# Patient Record
Sex: Female | Born: 1995 | Marital: Single | Smoking: Current every day smoker
Health system: Northeastern US, Academic
[De-identification: ages and names within clinical notes are randomized; demographics above are authoritative.]

## PROBLEM LIST (undated history)

## (undated) DIAGNOSIS — L0591 Pilonidal cyst without abscess: Secondary | ICD-10-CM

## (undated) HISTORY — DX: Pilonidal cyst without abscess: L05.91

## (undated) HISTORY — PX: WISDOM TOOTH EXTRACTION: SHX21

---
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
## 2013-04-25 ENCOUNTER — Emergency Department: Payer: Self-pay | Admitting: Emergency Medicine

## 2013-08-06 ENCOUNTER — Emergency Department: Payer: Self-pay | Admitting: Emergency Medicine

## 2013-08-06 LAB — URINALYSIS, COMPLETE
BILIRUBIN, UR: NEGATIVE
Glucose,UR: NEGATIVE mg/dL (ref 0–75)
KETONE: NEGATIVE
NITRITE: NEGATIVE
Ph: 6 (ref 4.5–8.0)
SPECIFIC GRAVITY: 1.009 (ref 1.003–1.030)
Squamous Epithelial: 1

## 2014-04-24 ENCOUNTER — Emergency Department: Payer: Self-pay | Admitting: Emergency Medicine

## 2014-06-05 NOTE — Op Note (Signed)
PATIENT NAME:  Lauren Cunningham, Lauren Cunningham MR#:  [id number] DATE OF BIRTH:  1995-05-05  DATE OF PROCEDURE:  05/21/2012  PREOPERATIVE DIAGNOSIS: Recurrent pilonidal abscess.   POSTOPERATIVE DIAGNOSIS: Recurrent pilonidal abscess.   OPERATION: Incision and drainage, pilonidal abscess.    SURGEON: Dr. Michela PitcherEly.   ANESTHESIA: General.  OPERATIVE PROCEDURE: With the patient in the supine position and the induction of appropriate general anesthesia she was placed in the prone position, appropriately padded and positioned. Buttocks cheeks were properly taped apart. The area was prepped and draped with sterile towels. The open area was probed and a large amount of foul-smelling purulent material was removed. Two counter-incisions were made. The abscess appeared to extend approximately 3 cm above the end of the previous incision. Penrose drains were placed because of the copious amount of infection. Suture was placed with 3-0 nylon. Sterile dressings were applied. The patient was awakened and returned to the recovery room in satisfactory.   Sponge, instrument, and needle counts were correct x 2 in the operating room.     ____________________________ Quentin Orealph L. Ely III, MD rle:dm D: 05/21/2012 11:30:00 ET Cunningham: 05/21/2012 12:10:55 ET JOB#: [id number]  cc: Carmie Endalph L. Ely III, MD, <Dictator> Quentin OreALPH L ELY MD ELECTRONICALLY SIGNED 05/22/2012 11:38

## 2014-06-07 NOTE — Op Note (Signed)
PATIENT NAME:  Lauren Cunningham, Lauren Cunningham MR#:  [id number] DATE OF BIRTH:  07/28/1995  DATE OF PROCEDURE:  08/21/2011  PREOPERATIVE DIAGNOSIS: Pilonidal cyst.   POSTOPERATIVE DIAGNOSIS: Pilonidal cyst.   OPERATION: Pilonidal cystectomy.    ANESTHESIA: General.   SURGEON: Quentin Orealph L. Ely, III, MD   OPERATIVE PROCEDURE: With the patient in the supine position and after the induction of appropriate general anesthesia, the patient was placed in the prone position, appropriately padded and positioned. Her buttocks were taped apart and the perineal area was prepped with Betadine. The area was infiltrated through one of the sinus tracts with methylene blue to help identify any residual tracts. An elliptical incision was made around the site of the cyst. Incision was carried down through the subcutaneous tissue with Bovie electrocautery. No sinus tracts were crossed containing methylene blue. The specimen was taken off the sacrum. It was marked and sent for pathology. It was copiously irrigated. Space was obliterated with three layers of 0 Vicryl. Subcutaneous space was closed with 3-0 Vicryl in a running fashion. Skin was closed with 3-0 nylon. Sterile dressing was applied. The patient was returned to the recovery room having tolerated the procedure well. Sponge, instrument, and needle counts were correct x2 in the operating room.   ____________________________ Quentin Orealph L. Ely III, MD rle:drc D: 08/21/2011 09:43:49 ET T: 08/21/2011 13:17:16 ET JOB#: [id number]  cc: Quentin Orealph L. Ely III, MD, <Dictator> Quentin OreALPH L ELY MD ELECTRONICALLY SIGNED 08/22/2011 16:01

## 2015-06-15 DIAGNOSIS — L0591 Pilonidal cyst without abscess: Secondary | ICD-10-CM | POA: Insufficient documentation

## 2015-06-15 NOTE — ED Notes (Addendum)
Pt in with co pain to coccyx for few months fell last week and has had pain to have pilonidal cyst removed.  Feels like incision site "pinches" at times.

## 2015-06-16 ENCOUNTER — Encounter: Payer: Self-pay | Admitting: Emergency Medicine

## 2015-06-16 ENCOUNTER — Emergency Department
Admission: EM | Admit: 2015-06-16 | Discharge: 2015-06-16 | Disposition: A | Discharge status: Home / Self Care | Payer: Self-pay | Attending: Emergency Medicine | Admitting: Emergency Medicine

## 2015-06-16 ENCOUNTER — Emergency Department: Payer: Self-pay

## 2015-06-16 DIAGNOSIS — L0591 Pilonidal cyst without abscess: Secondary | ICD-10-CM

## 2015-06-16 DIAGNOSIS — M533 Sacrococcygeal disorders, not elsewhere classified: Secondary | ICD-10-CM

## 2015-06-16 MED ORDER — IBUPROFEN 600 MG PO TABS
600.0000 mg | ORAL_TABLET | Freq: Once | ORAL | Status: AC
  Administered 2015-06-16: 600 mg via ORAL
  Filled 2015-06-16: qty 1

## 2015-06-16 MED ORDER — SULFAMETHOXAZOLE-TRIMETHOPRIM 800-160 MG PO TABS
1.0000 | ORAL_TABLET | Freq: Once | ORAL | Status: AC
  Administered 2015-06-16: 1 via ORAL
  Filled 2015-06-16: qty 1

## 2015-06-16 MED ORDER — SULFAMETHOXAZOLE-TRIMETHOPRIM 800-160 MG PO TABS
1.0000 | ORAL_TABLET | Freq: Two times a day (BID) | ORAL | Status: AC
Start: 2015-06-16 — End: 2015-06-22

## 2015-06-16 NOTE — Discharge Instructions (Signed)
Pilonidal Cyst  A pilonidal cyst is a fluid-filled sac. It forms beneath the skin near your tailbone, at the top of the crease of your buttocks. A pilonidal cyst that is not large or infected may not cause symptoms or problems.  If the cyst becomes irritated or infected, it may fill with pus. This causes pain and swelling (pilonidal abscess). An infected cyst may need to be treated with medicine, drained, or removed.  CAUSES  The cause of a pilonidal cyst is not known. One cause may be a hair that grows into your skin (ingrown hair).  RISK FACTORS  Pilonidal cysts are more common in boys and men. Risk factors include:  · Having lots of hair near the crease of the buttocks.  · Being overweight.  · Having a pilonidal dimple.  · Wearing tight clothing.  · Not bathing or showering frequently.  · Sitting for long periods of time.  SIGNS AND SYMPTOMS  Signs and symptoms of a pilonidal cyst may include:  · Redness.  · Pain and tenderness.  · Warmth.  · Swelling.  · Pus.  · Fever.  DIAGNOSIS  Your health care provider may diagnose a pilonidal cyst based on your symptoms and a physical exam. The health care provider may do a blood test to check for infection. If your cyst is draining pus, your health care provider may take a sample of the drainage to be tested at a laboratory.  TREATMENT  Surgery is the usual treatment for an infected pilonidal cyst. You may also have to take medicines before surgery. The type of surgery you have depends on the size and severity of the infected cyst. The different kinds of surgery include:  · Incision and drainage. This is a procedure to open and drain the cyst.  · Marsupialization. In this procedure, a large cyst or abscess may be opened and kept open by stitching the edges of the skin to the cyst walls.  · Cyst removal. This procedure involves opening the skin and removing all or part of the cyst.  HOME CARE INSTRUCTIONS  · Follow all of your surgeon's instructions carefully if you had  surgery.  · Take medicines only as directed by your health care provider.  · If you were prescribed an antibiotic medicine, finish it all even if you start to feel better.  · Keep the area around your pilonidal cyst clean and dry.  · Clean the area as directed by your health care provider. Pat the area dry with a clean towel. Do not rub it as this may cause bleeding.  · Remove hair from the area around the cyst as directed by your health care provider.  · Do not wear tight clothing or sit in one place for long periods of time.  · There are many different ways to close and cover an incision, including stitches, skin glue, and adhesive strips. Follow your health care provider's instructions on:    Incision care.    Bandage (dressing) changes and removal.    Incision closure removal.  SEEK MEDICAL CARE IF:   · You have drainage, redness, swelling, or pain at the site of the cyst.  · You have a fever.     This information is not intended to replace advice given to you by your health care provider. Make sure you discuss any questions you have with your health care provider.     Document Released: 01/28/2000 Document Revised: 02/20/2014 Document Reviewed: 06/19/2013  Elsevier Interactive Patient   Education ©2016 [employer]  -

## 2015-06-16 NOTE — ED Provider Notes (Signed)
[facility] Emergency Department Provider Note  ____________________________________________  Time seen: 1:50 AM  I have reviewed the triage vital signs and the nursing notes.   HISTORY  Chief Complaint Tailbone Pain     HPI Lauren FUERTES is a 20 y.o. female with history of pilonidal cyst removal performed by Dr. Michela Pitcher 4 years ago presents with currently 2 out of 10 pain at the site of her pilonidal cyst status post falling on that area approximately one week ago. Patient states that she has felt a pinching sensation at the site of the cyst removal for a few months but has worsened over the past week. Patient denies any aggravating or alleviating factors at this time.  Past medical history Pilonidal cysts There are no active problems to display for this patient.   Past Surgical History  Procedure Laterality Date  . Pilonidal cyst surgery      Current Outpatient Rx  Name  Route  Sig  Dispense  Refill  . medroxyPROGESTERone (DEPO-PROVERA) 150 MG/ML injection   Intramuscular   Inject 150 mg into the muscle every 3 (three) months.           Allergies No known drug allergies. History reviewed. No pertinent family history.  Social History Social History  Substance Use Topics  . Smoking status: Never Smoker   . Smokeless tobacco: None  . Alcohol Use: No    Review of Systems  Constitutional: Negative for fever. Eyes: Negative for visual changes. ENT: Negative for sore throat. Cardiovascular: Negative for chest pain. Respiratory: Negative for shortness of breath. Gastrointestinal: Negative for abdominal pain, vomiting and diarrhea. Genitourinary: Negative for dysuria. Musculoskeletal: Negative for back pain.Pain at lumbosacral area Skin: Negative for rash. Neurological: Negative for headaches, focal weakness or numbness.  10-point ROS otherwise negative.  ____________________________________________   PHYSICAL EXAM:  VITAL  SIGNS: ED Triage Vitals  Enc Vitals Group     BP 06/15/15 2332 115/73 mmHg     Pulse Rate 06/15/15 2332 80     Resp 06/15/15 2332 18     Temp 06/15/15 2332 98.2 F (36.8 C)     Temp Source 06/15/15 2332 Oral     SpO2 06/15/15 2332 100 %     Weight 06/15/15 2332 155 lb (70.308 kg)     Height 06/15/15 2332  (1.6 m)     Head Cir --      Peak Flow --      Pain Score 06/15/15 2333 3     Pain Loc --      Pain Edu? --      Excl. in GC? --      Constitutional: Alert and oriented. Well appearing and in no distress. Eyes: Conjunctivae are normal. PERRL. Normal extraocular movements. ENT   Head: Normocephalic and atraumatic.   Nose: No congestion/rhinnorhea.   Mouth/Throat: Mucous membranes are moist.   Neck: No stridor. Hematological/Lymphatic/Immunilogical: No cervical lymphadenopathy. Cardiovascular: Normal rate, regular rhythm. Normal and symmetric distal pulses are present in all extremities. No murmurs, rubs, or gallops. Respiratory: Normal respiratory effort without tachypnea nor retractions. Breath sounds are clear and equal bilaterally. No wheezes/rales/rhonchi. Gastrointestinal: Soft and nontender. No distention. There is no CVA tenderness. Genitourinary: deferred Musculoskeletal: Nontender with normal range of motion in all extremities. No joint effusions.  No lower extremity tenderness nor edema. Neurologic:  Normal speech and language. No gross focal neurologic deficits are appreciated. Speech is normal.  Skin:  Skin is warm, dry and intact. No rash  noted. Mild tenderness with palpation of the lumbosacral area scar tissue noted at site of pilonidal cyst removal. No overlying skin erythema no flocculence Psychiatric: Mood and affect are normal. Speech and behavior are normal. Patient exhibits appropriate insight and judgment.    RADIOLOGY  US Pelvis Limited (Edited Result - FINAL) Result time: 06/16/15 09:41:44   Procedure changed after final result from  US Misc Soft Tissue      Final result by Rad Results In Interface (06/16/15 09:41:44)   Narrative:   CLINICAL DATA: Coccygeal pain after a fall. Pilonidal cyst removed 4 years ago.  EXAM: SOFT TISSUE ULTRASOUND - MISCELLANEOUS  TECHNIQUE: Ultrasound images of the buttocks area retained at the location of scar from previous cyst removal for years ago.  COMPARISON: None.  FINDINGS: Images obtained demonstrate normal subcutaneous fatty tissues. No loculated fluid collection to suggest abscess.  IMPRESSION: No loculated fluid collections identified.   Electronically Signed By: Burman NievesWilliam Stevens M.D. On: 06/16/2015 03:30         INITIAL IMPRESSION / ASSESSMENT AND PLAN / ED COURSE  Pertinent labs & imaging results that were available during my care of the patient were reviewed by me and considered in my medical decision making (see chart for details).  Patient admits to recurrence of pilonidal cyst since removal 4 years ago consistent with current symptoms as such antibiotic as prescribed given possibility of early infection in the site  ____________________________________________   FINAL CLINICAL IMPRESSION(S) / ED DIAGNOSES  Final diagnoses:  Pilonidal cyst      Darci Currentandolph N Brown, MD 06/16/15 2251

## 2015-06-16 NOTE — ED Notes (Signed)
Pt reports cyst removed from coccyx area 4 years ago, scarring present.  Pt reports coccyx pain x 1 week, reports fall about a week ago but did not get checked out.  Pt reports pain around scarred area.  Pt NAD at this time, resp equal and unlabored, skin warm and dry

## 2015-06-16 NOTE — ED Notes (Signed)
Pt taken to US

## 2015-07-22 ENCOUNTER — Emergency Department
Admission: EM | Admit: 2015-07-22 | Discharge: 2015-07-22 | Disposition: A | Discharge status: Home / Self Care | Payer: Medicaid Other | Attending: Emergency Medicine | Admitting: Emergency Medicine

## 2015-07-22 ENCOUNTER — Encounter: Payer: Self-pay | Admitting: Emergency Medicine

## 2015-07-22 DIAGNOSIS — R519 Headache, unspecified: Secondary | ICD-10-CM

## 2015-07-22 DIAGNOSIS — G44219 Episodic tension-type headache, not intractable: Secondary | ICD-10-CM | POA: Insufficient documentation

## 2015-07-22 DIAGNOSIS — R51 Headache: Secondary | ICD-10-CM

## 2015-07-22 DIAGNOSIS — F1510 Other stimulant abuse, uncomplicated: Secondary | ICD-10-CM | POA: Insufficient documentation

## 2015-07-22 LAB — CBC WITH DIFFERENTIAL/PLATELET
BASOS ABS: 0 10*3/uL (ref 0–0.1)
BASOS PCT: 0 %
EOS ABS: 0.1 10*3/uL (ref 0–0.7)
Eosinophils Relative: 1 %
HEMATOCRIT: 43 % (ref 35.0–47.0)
HEMOGLOBIN: 14 g/dL (ref 12.0–16.0)
Lymphocytes Relative: 33 %
Lymphs Abs: 3.4 10*3/uL (ref 1.0–3.6)
MCH: 27 pg (ref 26.0–34.0)
MCHC: 32.5 g/dL (ref 32.0–36.0)
MCV: 83 fL (ref 80.0–100.0)
MONOS PCT: 7 %
Monocytes Absolute: 0.7 10*3/uL (ref 0.2–0.9)
NEUTROS ABS: 6 10*3/uL (ref 1.4–6.5)
NEUTROS PCT: 59 %
Platelets: 212 10*3/uL (ref 150–440)
RBC: 5.18 MIL/uL (ref 3.80–5.20)
RDW: 13.7 % (ref 11.5–14.5)
WBC: 10.2 10*3/uL (ref 3.6–11.0)

## 2015-07-22 LAB — HCG, QUANTITATIVE, PREGNANCY: HCG, BETA CHAIN, QUANT, S: 1 m[IU]/mL (ref <5)

## 2015-07-22 LAB — COMPREHENSIVE METABOLIC PANEL
ALK PHOS: 84 U/L (ref 38–126)
ALT: 14 U/L (ref 14–54)
ANION GAP: 7 (ref 5–15)
AST: 19 U/L (ref 15–41)
Albumin: 4.2 g/dL (ref 3.5–5.0)
BILIRUBIN TOTAL: 0.1 mg/dL — AB (ref 0.3–1.2)
BUN: 7 mg/dL (ref 6–20)
CALCIUM: 9 mg/dL (ref 8.9–10.3)
CO2: 23 mmol/L (ref 22–32)
CREATININE: 0.68 mg/dL (ref 0.44–1.00)
Chloride: 108 mmol/L (ref 101–111)
Glucose, Bld: 81 mg/dL (ref 65–99)
Potassium: 3.6 mmol/L (ref 3.5–5.1)
SODIUM: 138 mmol/L (ref 135–145)
TOTAL PROTEIN: 7.5 g/dL (ref 6.5–8.1)

## 2015-07-22 LAB — URINALYSIS COMPLETE WITH MICROSCOPIC (ARMC ONLY)
BACTERIA UA: NONE SEEN
Bilirubin Urine: NEGATIVE
Glucose, UA: NEGATIVE mg/dL
Hgb urine dipstick: NEGATIVE
Ketones, ur: NEGATIVE mg/dL
LEUKOCYTES UA: NEGATIVE
Nitrite: NEGATIVE
PH: 7 (ref 5.0–8.0)
PROTEIN: NEGATIVE mg/dL
SQUAMOUS EPITHELIAL / LPF: NONE SEEN
Specific Gravity, Urine: 1.006 (ref 1.005–1.030)

## 2015-07-22 LAB — POCT PREGNANCY, URINE: PREG TEST UR: NEGATIVE

## 2015-07-22 MED ORDER — PENTAFLUOROPROP-TETRAFLUOROETH EX AERO
INHALATION_SPRAY | CUTANEOUS | Status: AC
  Filled 2015-07-22: qty 30

## 2015-07-22 NOTE — ED Notes (Signed)
Pt denies having symptoms at this time. Pt reports feeling as though the headaches arrival while at work. Pt reports she works third shift and feels as though she is getting them more frequently with those hours. Pt reports she is getting 8+ hours of sleep but has had decreased PO intake which is new with new job at [employer].

## 2015-07-22 NOTE — ED Notes (Signed)
Patient states that she works at [employer] and gets headaches intermittently for the past 2 weeks.  Also reports that vision gets blurry with headaches.  Patient denies headaches currently.  Denies blurry vision currently.

## 2015-07-22 NOTE — ED Provider Notes (Signed)
[facility] Emergency Department Provider Note  ____________________________________________  Time seen: Approximately 6:33 PM  I have reviewed the triage vital signs and the nursing notes.   HISTORY  Chief Complaint Headache    HPI Lauren Cunningham is a 20 y.o. female who presents emergency department complaining of intermittent headaches for the last 2 weeks. Patient states that she typically has a headache per day and that it lasts approximately an hour to an hour and a half. She states intermittent blurry vision. Patient denies any symptoms at this time. Patient does admit to significant caffeine use over the last month to 2 months. She does endorse significant stress factors both at work as well as outside of work. Patient is attempting to change lifestyle factors to reduce amount of stress. No other complaints at this time. Patient denies fevers, chills, neck pain, chest pain, shortness of breath, nausea or vomiting. Patient denies any injury precipitating these complaints.   History reviewed. No pertinent past medical history.  There are no active problems to display for this patient.   Past Surgical History  Procedure Laterality Date  . Pilonidal cyst surgery      Current Outpatient Rx  Name  Route  Sig  Dispense  Refill  . medroxyPROGESTERone (DEPO-PROVERA) 150 MG/ML injection   Intramuscular   Inject 150 mg into the muscle every 3 (three) months.           Allergies Review of patient's allergies indicates no known allergies.  No family history on file.  Social History Social History  Substance Use Topics  . Smoking status: Never Smoker   . Smokeless tobacco: None  . Alcohol Use: No     Review of Systems  Constitutional: No fever/chills Eyes: Intermittent blurry vision associated with headaches. ENT: No upper respiratory complaints. Cardiovascular: no chest pain. Respiratory: no cough. No SOB. Gastrointestinal: Mild diarrhea.  Positive for nausea denies emesis. Musculoskeletal: Negative for musculoskeletal pain. Skin: Negative for rash, abrasions, lacerations, ecchymosis. Neurological: Positive for headache but denies focal weakness or numbness. 10-point ROS otherwise negative.  ____________________________________________   PHYSICAL EXAM:  VITAL SIGNS: ED Triage Vitals  Enc Vitals Group     BP 07/22/15 1613 117/75 mmHg     Pulse Rate 07/22/15 1613 99     Resp 07/22/15 1613 18     Temp 07/22/15 1613 98.6 F (37 C)     Temp Source 07/22/15 1613 Oral     SpO2 07/22/15 1613 99 %     Weight 07/22/15 1613 155 lb (70.308 kg)     Height 07/22/15 1613  (1.626 m)     Head Cir --      Peak Flow --      Pain Score 07/22/15 1614 0     Pain Loc --      Pain Edu? --      Excl. in GC? --      Constitutional: Alert and oriented. Well appearing and in no acute distress. Eyes: Conjunctivae are normal. PERRL. EOMI. Head: Atraumatic. Neck: No stridor. Neck is supple with full range of motion Hematological/Lymphatic/Immunilogical: No cervical lymphadenopathy. Cardiovascular: Normal rate, regular rhythm. Normal S1 and S2.  Good peripheral circulation. Respiratory: Normal respiratory effort without tachypnea or retractions. Lungs CTAB. Good air entry to the bases with no decreased or absent breath sounds. Gastrointestinal: Bowel sounds 4 quadrants. Soft and nontender to palpation. No guarding or rigidity. No palpable masses. No distention. No CVA tenderness. Musculoskeletal: Full range of motion to  all extremities. No gross deformities appreciated. Neurologic:  Normal speech and language. No gross focal neurologic deficits are appreciated. Cranial nerves II through XII grossly intact. Skin:  Skin is warm, dry and intact. No rash noted. Psychiatric: Mood and affect are normal. Speech and behavior are normal. Patient exhibits appropriate insight and judgement.   ____________________________________________    LABS (all labs ordered are listed, but only abnormal results are displayed)  Labs Reviewed  COMPREHENSIVE METABOLIC PANEL - Abnormal; Notable for the following:    Total Bilirubin 0.1 (*)    All other components within normal limits  URINALYSIS COMPLETEWITH MICROSCOPIC ([facility] ONLY) - Abnormal; Notable for the following:    Color, Urine STRAW (*)    APPearance CLEAR (*)    All other components within normal limits  CBC WITH DIFFERENTIAL/PLATELET  HCG, QUANTITATIVE, PREGNANCY  POC URINE PREG, ED  POCT PREGNANCY, URINE   ____________________________________________  EKG   ____________________________________________  RADIOLOGY   No results found.  ____________________________________________    PROCEDURES  Procedure(s) performed:       Medications  pentafluoroprop-tetrafluoroeth (GEBAUERS) aerosol (not administered)     ____________________________________________   INITIAL IMPRESSION / ASSESSMENT AND PLAN / ED COURSE  Pertinent labs & imaging results that were available during my care of the patient were reviewed by me and considered in my medical decision making (see chart for details).  Patient's diagnosis is consistent with Episodic headaches and caffeine abuse.Patient endorses significant amounts of caffeine use prior to the symptoms. Patient is also increased tobacco use. These these factors accompanied with exam and history are consistent with patient's symptoms. At this time, exam is reassuring and no imaging is ordered. Labs returned with reassuring results. Patient is counseled on caffeine use and distracting same as well as modifying other lifestyle factors.. Patient is to take Tylenol and/or intact laboratories for symptom control. She'll follow up with primary care if needed. . Patient is given ED precautions to return to the ED for any worsening or new symptoms.     ____________________________________________  FINAL CLINICAL IMPRESSION(S) / ED  DIAGNOSES  Final diagnoses:  Nonintractable episodic headache, unspecified headache type  Caffeine abuse      NEW MEDICATIONS STARTED DURING THIS VISIT:  Discharge Medication List as of 07/22/2015  6:38 PM          This chart was dictated using voice recognition software/Dragon. Despite best efforts to proofread, errors can occur which can change the meaning. Any change was purely unintentional.    Racheal PatchesJonathan D Cindi Ghazarian, PA-C 07/22/15 1925  Loleta Roseory Forbach, MD 07/22/15 [phone]

## 2015-07-22 NOTE — ED Notes (Signed)
AAOx3.  Skin warm and dry.  Moving all extremities equally and strong.  Eating and drinking in Triage. Denies all complaints of pain currently.  Denies any visual changes.

## 2015-07-22 NOTE — Discharge Instructions (Signed)

## 2016-03-23 ENCOUNTER — Emergency Department
Admission: EM | Admit: 2016-03-23 | Discharge: 2016-03-23 | Disposition: A | Discharge status: Home / Self Care | Payer: Medicaid Other | Attending: Emergency Medicine | Admitting: Emergency Medicine

## 2016-03-23 ENCOUNTER — Encounter: Payer: Self-pay | Admitting: Emergency Medicine

## 2016-03-23 DIAGNOSIS — F17210 Nicotine dependence, cigarettes, uncomplicated: Secondary | ICD-10-CM | POA: Insufficient documentation

## 2016-03-23 DIAGNOSIS — M26621 Arthralgia of right temporomandibular joint: Secondary | ICD-10-CM | POA: Insufficient documentation

## 2016-03-23 MED ORDER — KETOROLAC TROMETHAMINE 60 MG/2ML IM SOLN
30.0000 mg | Freq: Once | INTRAMUSCULAR | Status: AC
  Administered 2016-03-23: 30 mg via INTRAMUSCULAR
  Filled 2016-03-23: qty 2

## 2016-03-23 MED ORDER — KETOROLAC TROMETHAMINE 10 MG PO TABS: 10.0000 mg | ORAL_TABLET | Freq: Four times a day (QID) | ORAL | 0 refills | Status: AC | PRN

## 2016-03-23 NOTE — ED Triage Notes (Signed)
Pt presents to ED with c/o tooth pain located on the right lower part of her mouth. Area has been painful for the past month and has worsened the past couple of days. has not been seen by dentist due to lack of insurance. Denies drainage.

## 2016-03-23 NOTE — ED Provider Notes (Signed)
[facility] Emergency Department Provider Note  ____________________________________________  Time seen: Approximately 8:53 PM  I have reviewed the triage vital signs and the nursing notes.   HISTORY  Chief Complaint Dental Pain    HPI Lauren Cunningham is a 21 y.o. female presenting to the emergency department with aching 7/10 right jaw pain for the past month. Patient cannot localize pain to a particular tooth. Patient states that right jaw pain is worsened with chewing and yawning. Patient has noticed no gingival hypertrophy or erythema. She has been afebrile. She denies incidences of trauma to the jaw. She has tried [employer] powders, which "only help for 5 minutes". 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 Patient states that she currently does not have dental insurance. Patient does not currently have an appointment with the dentist.   History reviewed. No pertinent past medical history.  There are no active problems to display for this patient.   Past Surgical History:  Procedure Laterality Date  . pilonidal cyst surgery      Prior to Admission medications   Medication Sig Start Date End Date Taking? Authorizing Provider  ketorolac (TORADOL) 10 MG tablet Take 1 tablet (10 mg total) by mouth every 6 (six) hours as needed. 03/23/16 03/28/16  Orvil FeilJaclyn M Nichoals Heyde, PA-C  medroxyPROGESTERone (DEPO-PROVERA) 150 MG/ML injection Inject 150 mg into the muscle every 3 (three) months.    Historical Provider, MD    Allergies Patient has no known allergies.  No family history on file.  Social History Social History  Substance Use Topics  . Smoking status: Current Every Day Smoker    Packs/day: 0.50    Types: Cigarettes  . Smokeless tobacco: Never Used  . Alcohol use Yes     Review of Systems  Constitutional: No fever/chills Eyes: No visual changes. No discharge ENT: No upper respiratory complaints. Cardiovascular: no chest pain. Respiratory: no cough.  No SOB. Gastrointestinal: No abdominal pain.  No nausea, no vomiting.  No diarrhea.  No constipation. Musculoskeletal: Patient has right jaw pain.  Skin: Negative for rash, abrasions, lacerations, ecchymosis. Neurological: Negative for headaches, focal weakness or numbness. ____________________________________________   PHYSICAL EXAM:  VITAL SIGNS: ED Triage Vitals  Enc Vitals Group     BP 03/23/16 2011 125/66     Pulse Rate 03/23/16 2011 83     Resp 03/23/16 2011 18     Temp 03/23/16 2011 98.6 F (37 C)     Temp Source 03/23/16 2011 Oral     SpO2 03/23/16 2011 100 %     Weight 03/23/16 2012 168 lb (76.2 kg)     Height 03/23/16 2012 5\' 4"  (1.626 m)     Head Circumference --      Peak Flow --      Pain Score 03/23/16 2012 7     Pain Loc --      Pain Edu? --      Excl. in GC? --      Constitutional: Alert and oriented. Patient is crying throughout exam. Eyes: Palpebral and bulbar conjunctiva are nonerythematous bilaterally. PERRL. EOMI. No scleral icterus bilaterally. Head: Atraumatic. ENT:      Ears: Tympanic membranes are pearly bilaterally without effusion, erythema or purulent exudate. Bony landmarks are visualized bilaterally.       Nose: Skin overlying nares is without erythema. Nasal turbinates are non-erythematous. Nasal septum is midline.      Mouth/Throat: Mucous membranes are moist. Posterior pharynx is nonerythematous. No tonsillar exudate, hypertrophy  or petechiae visualized. Uvula is midline. No dental caries. Teeth are pearly and white. No gingival hypertrophy or erythema visualized. Patient has pain elicited with palpation and assessment of the TMJ joint, right. Neck: Full range of motion. No pain with neck flexion. Hematological/Lymphatic/Immunilogical: No cervical lymphadenopathy.  Cardiovascular: Normal rate, regular rhythm. Normal S1 and S2. No murmurs, gallops or rubs auscultated.  Respiratory: Trachea is midline. No retractions or presence of deformity.  Thoracic expansion is symmetric with unaccentuated tactile fremitus. Resonant and symmetric percussion tones bilaterally. On auscultation, adventitious sounds are absent.  Skin:  Skin is warm, dry and intact. No erythema or edema the skin overlying the jaw bilaterally. Psychiatric: Mood and affect are normal for age. Speech and behavior are normal.  ____________________________________________   LABS (all labs ordered are listed, but only abnormal results are displayed)  Labs Reviewed - No data to display ____________________________________________  EKG   ____________________________________________  RADIOLOGY  No results found.  ____________________________________________    PROCEDURES  Procedure(s) performed:    Procedures    Medications  ketorolac (TORADOL) injection 30 mg (30 mg Intramuscular Given 03/23/16 2059)     ____________________________________________   INITIAL IMPRESSION / ASSESSMENT AND PLAN / ED COURSE  Pertinent labs & imaging results that were available during my care of the patient were reviewed by me and considered in my medical decision making (see chart for details).  Review of the [place] CSRS was performed in accordance of the [facility] prior to dispensing any controlled drugs.    Assessment and Plan:  TMJ Pain.   Patient presents to the emergency department with right TMJ pain. An injection of Toradol was given in the emergency department. Patient was discharged with oral Toradol. Patient was advised to seek care with a dentist as soon as possible. All patient questions were answered.  ____________________________________________  FINAL CLINICAL IMPRESSION(S) / ED DIAGNOSES  Final diagnoses:  Arthralgia of right temporomandibular joint      NEW MEDICATIONS STARTED DURING THIS VISIT:  Discharge Medication List as of 03/23/2016  9:22 PM    START taking these medications   Details  ketorolac (TORADOL) 10 MG tablet Take 1 tablet (10 mg  total) by mouth every 6 (six) hours as needed., Starting Thu 03/23/2016, Until Tue 03/28/2016, Print            This chart was dictated using voice recognition software/Dragon. Despite best efforts to proofread, errors can occur which can change the meaning. Any change was purely unintentional.    Orvil Feil, PA-C 03/24/16 0035    Loleta Rose, MD 03/24/16 [phone]

## 2016-05-31 ENCOUNTER — Encounter: Payer: Self-pay | Admitting: *Deleted

## 2016-05-31 ENCOUNTER — Emergency Department
Admission: EM | Admit: 2016-05-31 | Discharge: 2016-05-31 | Disposition: A | Discharge status: Home / Self Care | Payer: Self-pay | Attending: Emergency Medicine | Admitting: Emergency Medicine

## 2016-05-31 DIAGNOSIS — F17210 Nicotine dependence, cigarettes, uncomplicated: Secondary | ICD-10-CM | POA: Insufficient documentation

## 2016-05-31 DIAGNOSIS — N898 Other specified noninflammatory disorders of vagina: Secondary | ICD-10-CM | POA: Insufficient documentation

## 2016-05-31 LAB — CHLAMYDIA/NGC RT PCR (ARMC ONLY)
Chlamydia Tr: NOT DETECTED
N GONORRHOEAE: NOT DETECTED

## 2016-05-31 LAB — URINALYSIS, ROUTINE W REFLEX MICROSCOPIC
Bilirubin Urine: NEGATIVE
Glucose, UA: NEGATIVE mg/dL
HGB URINE DIPSTICK: NEGATIVE
Ketones, ur: NEGATIVE mg/dL
Nitrite: NEGATIVE
Protein, ur: NEGATIVE mg/dL
SPECIFIC GRAVITY, URINE: 1.003 — AB (ref 1.005–1.030)
pH: 6 (ref 5.0–8.0)

## 2016-05-31 LAB — WET PREP, GENITAL
Clue Cells Wet Prep HPF POC: NONE SEEN
Sperm: NONE SEEN
TRICH WET PREP: NONE SEEN
YEAST WET PREP: NONE SEEN

## 2016-05-31 LAB — POCT PREGNANCY, URINE: PREG TEST UR: NEGATIVE

## 2016-05-31 MED ORDER — FLUCONAZOLE 100 MG PO TABS
150.0000 mg | ORAL_TABLET | Freq: Once | ORAL | Status: AC
  Administered 2016-05-31: 06:00:00 150 mg via ORAL
  Filled 2016-05-31: qty 1

## 2016-05-31 NOTE — ED Provider Notes (Signed)
[facility] Emergency Department Provider Note   First MD Initiated Contact with Patient 05/31/16 [phone]     (approximate)  I have reviewed the triage vital signs and the nursing notes.   HISTORY  Chief Complaint Vaginal Discharge   HPI Lauren Cunningham is a 21 y.o. female presents with five-day history of white vaginal discharge accompanied by urinary frequency and burning. Patient denies any fever no nausea vomiting. Patient admits to dyspareunia as well. Patient does admit to one sexual partner for the past 3 years does have unprotected sex.  Past medical history None There are no active problems to display for this patient.   Past Surgical History:  Procedure Laterality Date  . pilonidal cyst surgery      Prior to Admission medications   Medication Sig Start Date End Date Taking? Authorizing Provider  medroxyPROGESTERone (DEPO-PROVERA) 150 MG/ML injection Inject 150 mg into the muscle every 3 (three) months.    Historical Provider, MD    Allergies No known drug allergies  No family history on file.  Social History Social History  Substance Use Topics  . Smoking status: Current Every Day Smoker    Packs/day: 0.50    Types: Cigarettes  . Smokeless tobacco: Never Used  . Alcohol use Yes    Review of Systems Constitutional: No fever/chills Eyes: No visual changes. ENT: No sore throat. Cardiovascular: Denies chest pain. Respiratory: Denies shortness of breath. Gastrointestinal: No abdominal pain.  No nausea, no vomiting.  No diarrhea.  No constipation. Genitourinary: Positive for vaginal discharge and dysuria Musculoskeletal: Negative for back pain. Skin: Negative for rash. Neurological: Negative for headaches, focal weakness or numbness.  10-point ROS otherwise negative.  ____________________________________________   PHYSICAL EXAM:  VITAL SIGNS: ED Triage Vitals  Enc Vitals Group     BP 05/31/16 0337 116/69     Pulse Rate  05/31/16 0337 85     Resp 05/31/16 0337 16     Temp 05/31/16 0337 98.7 F (37.1 C)     Temp Source 05/31/16 0337 Oral     SpO2 05/31/16 0337 98 %     Weight 05/31/16 0337 160 lb (72.6 kg)     Height 05/31/16 0337  (1.6 m)     Head Circumference --      Peak Flow --      Pain Score 05/31/16 0340 6     Pain Loc --      Pain Edu? --      Excl. in GC? --     Constitutional: Alert and oriented. Well appearing and in no acute distress. Head: Atraumatic. Mouth/Throat: Mucous membranes are moist.  Oropharynx non-erythematous. Neck: No stridor.  Cardiovascular: Normal rate, regular rhythm. Good peripheral circulation. Grossly normal heart sounds. Respiratory: Normal respiratory effort.  No retractions. Lungs CTAB. Gastrointestinal: Soft and nontender. No distention.  Genitourinary: Yellowish white vaginal discharge cervical erythema Musculoskeletal: No lower extremity tenderness nor edema. No gross deformities of extremities. Neurologic:  Normal speech and language. No gross focal neurologic deficits are appreciated.  Skin:  Skin is warm, dry and intact. No rash noted. Psychiatric: Mood and affect are normal. Speech and behavior are normal.  ____________________________________________   LABS (all labs ordered are listed, but only abnormal results are displayed)  Labs Reviewed  WET PREP, GENITAL - Abnormal; Notable for the following:       Result Value   WBC, Wet Prep HPF POC FEW (*)    All other components within normal limits  URINALYSIS, ROUTINE W REFLEX MICROSCOPIC - Abnormal; Notable for the following:    Color, Urine STRAW (*)    APPearance CLEAR (*)    Specific Gravity, Urine 1.003 (*)    Leukocytes, UA LARGE (*)    Bacteria, UA RARE (*)    Squamous Epithelial / LPF 0-5 (*)    All other components within normal limits  CHLAMYDIA/NGC RT PCR ([facility] ONLY)  URINALYSIS, COMPLETE (UACMP) WITH MICROSCOPIC  POCT PREGNANCY, URINE  POC URINE PREG, ED        Procedures   ____________________________________________   INITIAL IMPRESSION / ASSESSMENT AND PLAN / ED COURSE  Pertinent labs & imaging results that were available during my care of the patient were reviewed by me and considered in my medical decision making (see chart for details).  Awaiting GC culture. Care transferred to Dr Roxan Hockey      ____________________________________________  FINAL CLINICAL IMPRESSION(S) / ED DIAGNOSES  Final diagnoses:  Vaginal discharge     MEDICATIONS GIVEN DURING THIS VISIT:  Medications  fluconazole (DIFLUCAN) tablet 150 mg (150 mg Oral Given 05/31/16 0627)     NEW OUTPATIENT MEDICATIONS STARTED DURING THIS VISIT:  New Prescriptions   No medications on file    Modified Medications   No medications on file    Discontinued Medications   No medications on file     Note:  This document was prepared using Dragon voice recognition software and may include unintentional dictation errors.    Darci Current, MD 05/31/16 [phone]

## 2016-05-31 NOTE — ED Notes (Signed)
Pt states unable to urinate at this time, given instructions on providing sample when able.

## 2016-05-31 NOTE — Discharge Instructions (Signed)
Follow up with PCP and/or public health department. Return for worsening fevers, pain, concerns.

## 2016-05-31 NOTE — ED Triage Notes (Signed)
Pt states vaginal pain for several days with white discharge and she has had urinary frequency and burning. Pt denies fevers, n/v.

## 2016-09-21 ENCOUNTER — Emergency Department
Admission: EM | Admit: 2016-09-21 | Discharge: 2016-09-22 | Disposition: A | Discharge status: Home / Self Care | Payer: Self-pay | Attending: Emergency Medicine | Admitting: Emergency Medicine

## 2016-09-21 ENCOUNTER — Encounter: Payer: Self-pay | Admitting: *Deleted

## 2016-09-21 ENCOUNTER — Emergency Department: Payer: Self-pay

## 2016-09-21 DIAGNOSIS — Z79899 Other long term (current) drug therapy: Secondary | ICD-10-CM | POA: Insufficient documentation

## 2016-09-21 DIAGNOSIS — K5900 Constipation, unspecified: Secondary | ICD-10-CM

## 2016-09-21 DIAGNOSIS — F17210 Nicotine dependence, cigarettes, uncomplicated: Secondary | ICD-10-CM | POA: Insufficient documentation

## 2016-09-21 DIAGNOSIS — R1033 Periumbilical pain: Secondary | ICD-10-CM | POA: Insufficient documentation

## 2016-09-21 LAB — URINALYSIS, COMPLETE (UACMP) WITH MICROSCOPIC
BILIRUBIN URINE: NEGATIVE
GLUCOSE, UA: NEGATIVE mg/dL
Hgb urine dipstick: NEGATIVE
KETONES UR: NEGATIVE mg/dL
Nitrite: NEGATIVE
PH: 7 (ref 5.0–8.0)
Protein, ur: NEGATIVE mg/dL
Specific Gravity, Urine: 1.012 (ref 1.005–1.030)

## 2016-09-21 LAB — POCT PREGNANCY, URINE: Preg Test, Ur: NEGATIVE

## 2016-09-21 MED ORDER — SENNOSIDES-DOCUSATE SODIUM 8.6-50 MG PO TABS: 2.0000 | ORAL_TABLET | Freq: Two times a day (BID) | ORAL | 0 refills | Status: DC

## 2016-09-21 MED ORDER — POLYETHYLENE GLYCOL 3350 17 GM/SCOOP PO POWD: ORAL | 0 refills | Status: DC

## 2016-09-21 MED ORDER — SIMETHICONE 80 MG PO CHEW: 80.0000 mg | CHEWABLE_TABLET | Freq: Four times a day (QID) | ORAL | 2 refills | Status: DC | PRN

## 2016-09-21 NOTE — ED Notes (Signed)
Asked if patient if she able to provide a urine sample, pt asked for some water and said she will provide a urine sample shortly. Pt given a call bell and asked to let us know when she is ready to provide a sample.

## 2016-09-21 NOTE — ED Provider Notes (Signed)
[facility] Emergency Department Provider Note  ____________________________________________  Time seen: Approximately 10:55 PM  I have reviewed the triage vital signs and the nursing notes.   HISTORY  Chief Complaint Abdominal Pain    HPI Lauren Cunningham is a 21 y.o. female reports waking up this morning about 6 AM with periumbilical pain. Pain is been an intermittent episodes lasting about an hour for recurrent throughout the day, severe and sharp when present, nonradiating. No aggravating or alleviating factors. No nausea vomiting diarrhea or constipation no unusual vaginal bleeding or discharge. Last was 3 years ago because she uses Depo-Provera injections. No fever chills or sweats. No history of ovarian cysts. Denies urinary symptoms.  Reports she normally has a bowel movement about every other day. Last bowel movement yesterday, not passing gas today.   No past medical history on file. None  There are no active problems to display for this patient.    Past Surgical History:  Procedure Laterality Date  . pilonidal cyst surgery       Prior to Admission medications   Medication Sig Start Date End Date Taking? Authorizing Provider  medroxyPROGESTERone (DEPO-PROVERA) 150 MG/ML injection Inject 150 mg into the muscle every 3 (three) months.    [provider]  simethicone (GAS-X) 80 MG chewable tablet Chew 1 tablet (80 mg total) by mouth 4 (four) times daily as needed. 09/21/16 09/21/17  Sharman Cheek, MD     Allergies Patient has no known allergies.   No family history on file.  Social History Social History  Substance Use Topics  . Smoking status: Current Every Day Smoker    Packs/day: 0.50    Types: Cigarettes  . Smokeless tobacco: Never Used  . Alcohol use Yes    Review of Systems  Constitutional:   No fever or chills.  ENT:   No sore throat. No rhinorrhea. Cardiovascular:   No chest pain or syncope. Respiratory:   No  dyspnea or cough. Gastrointestinal:   Positive as above for abdominal pain without vomiting and diarrhea.  Musculoskeletal:   Negative for focal pain or swelling All other systems reviewed and are negative except as documented above in ROS and HPI.  ____________________________________________   PHYSICAL EXAM:  VITAL SIGNS: ED Triage Vitals  Enc Vitals Group     BP 09/21/16 2129 122/71     Pulse Rate 09/21/16 2129 92     Resp 09/21/16 2129 18     Temp 09/21/16 2129 98.3 F (36.8 C)     Temp Source 09/21/16 2129 Oral     SpO2 09/21/16 2129 100 %     Weight 09/21/16 2128 160 lb (72.6 kg)     Height 09/21/16 2128 5\' 3"  (1.6 m)     Head Circumference --      Peak Flow --      Pain Score 09/21/16 2130 4     Pain Loc --      Pain Edu? --      Excl. in GC? --     Vital signs reviewed, nursing assessments reviewed.   Constitutional:   Alert and oriented. Well appearing and in no distress. Eyes:   No scleral icterus.  EOMI. No nystagmus. No conjunctival pallor. PERRL. ENT   Head:   Normocephalic and atraumatic.   Nose:   No congestion/rhinnorhea.    Mouth/Throat:   MMM, no pharyngeal erythema. No peritonsillar mass.    Neck:   No meningismus. Full ROM Hematological/Lymphatic/Immunilogical:   No cervical  lymphadenopathy. Cardiovascular:   RRR. Symmetric bilateral radial and DP pulses.  No murmurs.  Respiratory:   Normal respiratory effort without tachypnea/retractions. Breath sounds are clear and equal bilaterally. No wheezes/rales/rhonchi. Gastrointestinal:   Soft and nontender. Non distended. There is no CVA tenderness.  No rebound, rigidity, or guarding. Genitourinary:   deferred Musculoskeletal:   Normal range of motion in all extremities. No joint effusions.  No lower extremity tenderness.  No edema. Neurologic:   Normal speech and language.  Motor grossly intact. No gross focal neurologic deficits are appreciated.  Skin:    Skin is warm, dry and intact. No  rash noted.  No petechiae, purpura, or bullae.  ____________________________________________    LABS (pertinent positives/negatives) (all labs ordered are listed, but only abnormal results are displayed) Labs Reviewed  URINALYSIS, COMPLETE (UACMP) WITH MICROSCOPIC - Abnormal; Notable for the following:       Result Value   Color, Urine YELLOW (*)    APPearance CLEAR (*)    Leukocytes, UA TRACE (*)    Bacteria, UA RARE (*)    Squamous Epithelial / LPF 0-5 (*)    All other components within normal limits  POC URINE PREG, ED  POCT PREGNANCY, URINE   ____________________________________________   EKG    ____________________________________________    RADIOLOGY  No results found.  ____________________________________________   PROCEDURES Procedures  ____________________________________________   INITIAL IMPRESSION / ASSESSMENT AND PLAN / ED COURSE  Pertinent labs & imaging results that were available during my care of the patient were reviewed by me and considered in my medical decision making (see chart for details).  Patient well appearing no acute distress, normal vital signs, benign and reassuring exam. Low suspicion for perforation obstruction STI PID torsion cholecystitis pancreatitis or appendicitis. Urinalysis normal, pregnancy negative. Check a chest x-ray for signs of obstruction, otherwise would treat with Gas-X conservative management for likely constipation and gas pain. Follow-up with primary care.      ____________________________________________   FINAL CLINICAL IMPRESSION(S) / ED DIAGNOSES  Final diagnoses:  Periumbilical pain      New Prescriptions   SIMETHICONE (GAS-X) 80 MG CHEWABLE TABLET    Chew 1 tablet (80 mg total) by mouth 4 (four) times daily as needed.     Portions of this note were generated with dragon dictation software. Dictation errors may occur despite best attempts at proofreading.    Sharman CheekStafford, Annaliese Saez, MD 09/21/16  [id number]

## 2016-09-21 NOTE — ED Triage Notes (Addendum)
Pt reports she has abd pain. Pt reports nausea.  No vomiting or diarrhea.  No vag bleeding  No dysuria. Pt refusing to have labs and urine done at this time. Pt alert. Pt states it's just gas maybe, so I don't think I need test done.

## 2016-09-22 NOTE — ED Notes (Signed)

## 2017-02-13 DIAGNOSIS — L0591 Pilonidal cyst without abscess: Secondary | ICD-10-CM

## 2017-02-13 HISTORY — DX: Pilonidal cyst without abscess: L05.91

## 2017-03-07 ENCOUNTER — Encounter: Payer: Self-pay | Admitting: Emergency Medicine

## 2017-03-07 ENCOUNTER — Other Ambulatory Visit: Payer: Self-pay

## 2017-03-07 ENCOUNTER — Emergency Department
Admission: EM | Admit: 2017-03-07 | Discharge: 2017-03-07 | Disposition: A | Discharge status: Home / Self Care | Payer: Self-pay | Attending: Emergency Medicine | Admitting: Emergency Medicine

## 2017-03-07 ENCOUNTER — Emergency Department: Payer: Self-pay

## 2017-03-07 DIAGNOSIS — S8002XA Contusion of left knee, initial encounter: Secondary | ICD-10-CM | POA: Insufficient documentation

## 2017-03-07 DIAGNOSIS — W19XXXA Unspecified fall, initial encounter: Secondary | ICD-10-CM

## 2017-03-07 DIAGNOSIS — W010XXA Fall on same level from slipping, tripping and stumbling without subsequent striking against object, initial encounter: Secondary | ICD-10-CM | POA: Insufficient documentation

## 2017-03-07 DIAGNOSIS — F17210 Nicotine dependence, cigarettes, uncomplicated: Secondary | ICD-10-CM | POA: Insufficient documentation

## 2017-03-07 DIAGNOSIS — Y939 Activity, unspecified: Secondary | ICD-10-CM | POA: Insufficient documentation

## 2017-03-07 DIAGNOSIS — M25562 Pain in left knee: Secondary | ICD-10-CM

## 2017-03-07 DIAGNOSIS — Y929 Unspecified place or not applicable: Secondary | ICD-10-CM | POA: Insufficient documentation

## 2017-03-07 DIAGNOSIS — Y990 Civilian activity done for income or pay: Secondary | ICD-10-CM | POA: Insufficient documentation

## 2017-03-07 DIAGNOSIS — Z79899 Other long term (current) drug therapy: Secondary | ICD-10-CM | POA: Insufficient documentation

## 2017-03-07 LAB — POCT PREGNANCY, URINE: PREG TEST UR: NEGATIVE

## 2017-03-07 NOTE — ED Provider Notes (Signed)
[facility] Emergency Department Provider Note  ____________________________________________   First MD Initiated Contact with Patient 03/07/17 [phone]     (approximate)  I have reviewed the triage vital signs and the nursing notes.   HISTORY  Chief Complaint Knee Pain    HPI Lauren Cunningham is a 22 y.o. female with no contributory past medical history who presents for acute onset left knee pain after falling on it at work a couple of days ago.  She reports that she was carrying something heavy and slipped and fell onto the left knee.  She applied ice and took ibuprofen but it is not helped very much.  It continues to hurt after a couple of days but she is able to ambulate with no difficulties.  She reported to triage that pain only increases when she straightens it, but when I evaluated her she has her leg completely straight in the exam bed.  She has no swelling at this time and no obvious bruising.  She is not unstable or unsteady of gait.  She denies any other injuries and felt no popping or snapping sensation.   History reviewed. No pertinent past medical history.  There are no active problems to display for this patient.   Past Surgical History:  Procedure Laterality Date  . pilonidal cyst surgery      Prior to Admission medications   Medication Sig Start Date End Date Taking? Authorizing Provider  medroxyPROGESTERone (DEPO-PROVERA) 150 MG/ML injection Inject 150 mg into the muscle every 3 (three) months.    [provider]  polyethylene glycol powder (GLYCOLAX/MIRALAX) powder 1 cap full in a full glass of water, two times a day for 3 days. 09/21/16   Sharman CheekStafford, Phillip, MD  senna-docusate (SENOKOT-S) 8.6-50 MG tablet Take 2 tablets by mouth 2 (two) times daily. 09/21/16   Sharman CheekStafford, Phillip, MD  simethicone (GAS-X) 80 MG chewable tablet Chew 1 tablet (80 mg total) by mouth 4 (four) times daily as needed. 09/21/16 09/21/17  Sharman CheekStafford, Phillip, MD     Allergies Patient has no known allergies.  No family history on file.  Social History Social History   Tobacco Use  . Smoking status: Current Every Day Smoker    Packs/day: 0.50    Types: Cigarettes  . Smokeless tobacco: Never Used  Substance Use Topics  . Alcohol use: Yes  . Drug use: No    Review of Systems Constitutional: No fever/chills Respiratory: Denies shortness of breath. Gastrointestinal: No abdominal pain.  No nausea, no vomiting.   Musculoskeletal: Pain in left knee after fall a couple of days ago Integumentary: Negative for bruising, swelling, and lacerations Neurological: Negative for headaches, focal weakness or numbness.   ____________________________________________   PHYSICAL EXAM:  VITAL SIGNS: ED Triage Vitals  Enc Vitals Group     BP 03/07/17 0135 (!) 143/76     Pulse Rate 03/07/17 0135 93     Resp 03/07/17 0135 18     Temp 03/07/17 0135 99 F (37.2 C)     Temp Source 03/07/17 0135 Oral     SpO2 03/07/17 0135 100 %     Weight 03/07/17 0136 77.1 kg (170 lb)     Height 03/07/17 0136 1.6 m (5\' 3" )     Head Circumference --      Peak Flow --      Pain Score 03/07/17 0136 1     Pain Loc --      Pain Edu? --  Excl. in GC? --     Constitutional: Alert and oriented. Well appearing and in no acute distress. Eyes: Conjunctivae are normal.  Head: Atraumatic. Cardiovascular: Normal rate, regular rhythm. Good peripheral circulation.  Respiratory: Normal respiratory effort.  No retractions.  Musculoskeletal: No left knee effusion, ecchymosis, or deformity.  No joint laxity with full range of motion and Lachman test.  No tenderness to palpation in the patient denies any pain or tenderness with full flexion and extension Neurologic:  Normal speech and language. No gross focal neurologic deficits are appreciated.  Skin:  Skin is warm, dry and intact. No rash noted. Psychiatric: Mood and affect are normal. Speech and behavior are  normal.  ____________________________________________   LABS (all labs ordered are listed, but only abnormal results are displayed)  Labs Reviewed  POC URINE PREG, ED  POCT PREGNANCY, URINE   ____________________________________________  EKG  None - EKG not ordered by ED physician ____________________________________________  RADIOLOGY   Dg Knee Complete 4 Views Left  Result Date: 03/07/2017 CLINICAL DATA:  Fall with knee pain EXAM: LEFT KNEE - COMPLETE 4+ VIEW COMPARISON:  None. FINDINGS: No evidence of fracture, dislocation, or joint effusion. No evidence of arthropathy or other focal bone abnormality. Soft tissues are unremarkable. IMPRESSION: Negative. Electronically Signed   By: Jasmine Pang M.D.   On: 03/07/2017 01:58    ____________________________________________   PROCEDURES  Critical Care performed: No   Procedure(s) performed:   Procedures   ____________________________________________   INITIAL IMPRESSION / ASSESSMENT AND PLAN / ED COURSE  As part of my medical decision making, I reviewed the following data within the electronic MEDICAL RECORD NUMBER Nursing notes reviewed and incorporated and Radiograph reviewed     The patient is not reporting any pain or tenderness at this time and has what appears to be a completely normal physical exam and no evidence of acute injury on radiographs.  I offered crutches but she declined.  I also offered a note for her to have off of work today since she has been in the ED all night at all.  I will provide an Ace wrap and give her information about [facility].    I gave my usual and customary return precautions.      ____________________________________________  FINAL CLINICAL IMPRESSION(S) / ED DIAGNOSES  Final diagnoses:  Acute pain of left knee  Fall, initial encounter  Contusion of left knee, initial encounter     MEDICATIONS GIVEN DURING THIS VISIT:  Medications - No data to display   ED Discharge Orders     None       Note:  This document was prepared using Dragon voice recognition software and may include unintentional dictation errors.    Loleta Rose, MD 03/07/17 [phone]

## 2017-03-07 NOTE — ED Triage Notes (Signed)
Pt presents to ED with left knee pain after falling at work on Monday. Ice applied immediately and pt reports taking ibuprofen at home with little relief. Pt states that pain only increases when straightening knee. ambulatory to triage with steady gait.

## 2017-04-05 LAB — HM PAP SMEAR: HM Pap smear: NEGATIVE

## 2017-10-02 ENCOUNTER — Emergency Department
Admission: EM | Admit: 2017-10-02 | Discharge: 2017-10-02 | Disposition: A | Discharge status: Home / Self Care | Payer: BLUE CROSS/BLUE SHIELD | Attending: Emergency Medicine | Admitting: Emergency Medicine

## 2017-10-02 ENCOUNTER — Encounter: Payer: Self-pay | Admitting: Emergency Medicine

## 2017-10-02 DIAGNOSIS — Z79899 Other long term (current) drug therapy: Secondary | ICD-10-CM | POA: Insufficient documentation

## 2017-10-02 DIAGNOSIS — F17210 Nicotine dependence, cigarettes, uncomplicated: Secondary | ICD-10-CM | POA: Diagnosis not present

## 2017-10-02 DIAGNOSIS — L0591 Pilonidal cyst without abscess: Secondary | ICD-10-CM | POA: Diagnosis not present

## 2017-10-02 DIAGNOSIS — L0231 Cutaneous abscess of buttock: Secondary | ICD-10-CM | POA: Diagnosis present

## 2017-10-02 MED ORDER — CLINDAMYCIN HCL 300 MG PO CAPS: 300.0000 mg | ORAL_CAPSULE | Freq: Three times a day (TID) | ORAL | 0 refills | Status: AC

## 2017-10-02 NOTE — ED Provider Notes (Signed)
[facility] Emergency Department Provider Note  ____________________________________________  Time seen: Approximately 1:19 PM  I have reviewed the triage vital signs and the nursing notes.   HISTORY  Chief Complaint Abscess    HPI Lauren MarvelBreanna T Cressman is a 22 y.o. female that presents to the emergency department for evaluation of increased pain and swelling to top of buttocks for 2 weeks.  Patient had pilonidal cyst surgery several years ago.  2 weeks ago she noticed some swelling to this area.  It started to become painful within the last week.  It is not painful unless she presses on the area.  No fever, chills.   History reviewed. No pertinent past medical history.  There are no active problems to display for this patient.   Past Surgical History:  Procedure Laterality Date  . pilonidal cyst surgery      Prior to Admission medications   Medication Sig Start Date End Date Taking? Authorizing Provider  clindamycin (CLEOCIN) 300 MG capsule Take 1 capsule (300 mg total) by mouth 3 (three) times daily for 10 days. 10/02/17 10/12/17  Enid DerryWagner, Tevita Gomer, PA-C  medroxyPROGESTERone (DEPO-PROVERA) 150 MG/ML injection Inject 150 mg into the muscle every 3 (three) months.    [provider]  polyethylene glycol powder (GLYCOLAX/MIRALAX) powder 1 cap full in a full glass of water, two times a day for 3 days. 09/21/16   Sharman CheekStafford, Phillip, MD  senna-docusate (SENOKOT-S) 8.6-50 MG tablet Take 2 tablets by mouth 2 (two) times daily. 09/21/16   Sharman CheekStafford, Phillip, MD  simethicone (GAS-X) 80 MG chewable tablet Chew 1 tablet (80 mg total) by mouth 4 (four) times daily as needed. 09/21/16 09/21/17  Sharman CheekStafford, Phillip, MD    Allergies Patient has no known allergies.  No family history on file.  Social History Social History   Tobacco Use  . Smoking status: Current Every Day Smoker    Packs/day: 0.50    Types: Cigarettes  . Smokeless tobacco: Never Used  Substance Use  Topics  . Alcohol use: Yes  . Drug use: No     Review of Systems  Constitutional: No fever/chills Gastrointestinal:  No nausea, no vomiting.  Musculoskeletal: Negative for musculoskeletal pain. Skin: Negative for abrasions, lacerations, ecchymosis.   ____________________________________________   PHYSICAL EXAM:  VITAL SIGNS: ED Triage Vitals  Enc Vitals Group     BP 10/02/17 1215 110/77     Pulse Rate 10/02/17 1215 83     Resp 10/02/17 1215 18     Temp 10/02/17 1215 97.7 F (36.5 C)     Temp Source 10/02/17 1215 Oral     SpO2 10/02/17 1215 100 %     Weight 10/02/17 1208 150 lb (68 kg)     Height 10/02/17 1208 5\' 4"  (1.626 m)     Head Circumference --      Peak Flow --      Pain Score 10/02/17 1208 3     Pain Loc --      Pain Edu? --      Excl. in GC? --      Constitutional: Alert and oriented. Well appearing and in no acute distress. Eyes: Conjunctivae are normal. PERRL. EOMI. Head: Atraumatic. ENT:      Ears:      Nose: No congestion/rhinnorhea.      Mouth/Throat: Mucous membranes are moist.  Neck: No stridor.  Cardiovascular: Normal rate, regular rhythm.  Good peripheral circulation. Respiratory: Normal respiratory effort without tachypnea or retractions. Lungs CTAB. Good air  entry to the bases with no decreased or absent breath sounds. Musculoskeletal: Full range of motion to all extremities. No gross deformities appreciated. Neurologic:  Normal speech and language. No gross focal neurologic deficits are appreciated.  Skin:  Skin is warm, dry and intact.  Pea-sized area of swelling to superior gluteal cleft.  No overlying erythema.  Minimal tenderness to palpation. Psychiatric: Mood and affect are normal. Speech and behavior are normal. Patient exhibits appropriate insight and judgement.   ____________________________________________   LABS (all labs ordered are listed, but only abnormal results are displayed)  Labs Reviewed - No data to  display ____________________________________________  EKG   ____________________________________________  RADIOLOGY   No results found.  ____________________________________________    PROCEDURES  Procedure(s) performed:    Procedures    Medications - No data to display   ____________________________________________   INITIAL IMPRESSION / ASSESSMENT AND PLAN / ED COURSE  Pertinent labs & imaging results that were available during my care of the patient were reviewed by me and considered in my medical decision making (see chart for details).  Review of the [place] CSRS was performed in accordance of the [facility] prior to dispensing any controlled drugs.     Patient's diagnosis is consistent with pilonidal cyst.  Vital signs and exam are reassuring.  Patient states that area is becoming more painful over the last 2 weeks. We discussed attempting incision and drainage given that area is increasing in pain even though she is only mildly tender to palpation over this spot. Patient declines I&D and chooses to wait if she needs procedure until her boyfriend is off work. She will return to the emergency department if area worsens with antibiotics. She will call surgeon today.  Patient will be discharged home with prescriptions for clindamycin. Patient is to follow up with general surgery as directed. Patient is given ED precautions to return to the ED for any worsening or new symptoms.     ____________________________________________  FINAL CLINICAL IMPRESSION(S) / ED DIAGNOSES  Final diagnoses:  Pilonidal cyst      NEW MEDICATIONS STARTED DURING THIS VISIT:  ED Discharge Orders         Ordered    clindamycin (CLEOCIN) 300 MG capsule  3 times daily     10/02/17 1341              This chart was dictated using voice recognition software/Dragon. Despite best efforts to proofread, errors can occur which can change the meaning. Any change was purely unintentional.     Enid DerryWagner, Peightyn Roberson, PA-C 10/02/17 1524    Arnaldo NatalMalinda, Paul F, MD 10/09/17 [phone]

## 2017-10-02 NOTE — ED Triage Notes (Signed)
Pt reports had surgery on a cysts years ago and a couple of weeks ago she noticed a bump there. Pt reports area is getting bigger and is painful to touch,.

## 2017-10-03 ENCOUNTER — Encounter: Payer: Self-pay | Admitting: Surgery

## 2017-10-03 ENCOUNTER — Ambulatory Visit (INDEPENDENT_AMBULATORY_CARE_PROVIDER_SITE_OTHER): Payer: BLUE CROSS/BLUE SHIELD | Admitting: Surgery

## 2017-10-03 VITALS — BP 114/78 | HR 80 | Temp 97.7°F | Resp 12 | Ht 64.0 in | Wt 154.0 lb

## 2017-10-03 DIAGNOSIS — L988 Other specified disorders of the skin and subcutaneous tissue: Secondary | ICD-10-CM | POA: Diagnosis not present

## 2017-10-03 NOTE — Progress Notes (Signed)
Patient ID: Lauren Cunningham, female   DOB: 01/28/1996, 22 y.o.   MRN: [id number]  HPI Lauren Cunningham is a 22 y.o. female pain after a recent visit to the emergency room for recurrent pilonidal disease.  Of note she had a previous history of excision of pilonidal disease by Dr. Michela PitcherEly several years ago.  Last couple weeks she has noticed increasing pain and swelling in the sacral area.  The pain is intermittent moderate intensity and sharp in nature.  Pain worsens when she applies some pressure in that area.  No fevers no chills.  She has been prescribed clindamycin by the emergency room and started that last night  HPI  Past Medical History:  Diagnosis Date  . Pilonidal cyst 2019    Past Surgical History:  Procedure Laterality Date  . pilonidal cyst surgery  2011   Dr Michela PitcherEly    History reviewed. No pertinent family history.  Social History Social History   Tobacco Use  . Smoking status: Current Every Day Smoker    Packs/day: 0.50    Years: 3.00    Pack years: 1.50    Types: Cigarettes  . Smokeless tobacco: Never Used  Substance Use Topics  . Alcohol use: Yes    Comment: rare  . Drug use: No    No Known Allergies  Current Outpatient Medications  Medication Sig Dispense Refill  . clindamycin (CLEOCIN) 300 MG capsule Take 1 capsule (300 mg total) by mouth 3 (three) times daily for 10 days. 30 capsule 0  . medroxyPROGESTERone (DEPO-PROVERA) 150 MG/ML injection Inject 150 mg into the muscle every 3 (three) months.    . polyethylene glycol powder (GLYCOLAX/MIRALAX) powder 1 cap full in a full glass of water, two times a day for 3 days. (Patient not taking: Reported on 10/03/2017) 255 g 0  . senna-docusate (SENOKOT-S) 8.6-50 MG tablet Take 2 tablets by mouth 2 (two) times daily. (Patient not taking: Reported on 10/03/2017) 120 tablet 0   No current facility-administered medications for this visit.      Review of Systems Full ROS  was asked and was negative except for the information  on the HPI  Physical Exam Blood pressure 114/78, pulse 80, temperature 97.7 F (36.5 C), temperature source Skin, resp. rate 12, height 5\' 4"  (1.626 m), weight 154 lb (69.9 kg), SpO2 99 %. CONSTITUTIONAL: NAD EYES: Pupils are equal, round, and reactive to light, Sclera are non-icteric. GI: The abdomen is  soft, nontender, and nondistended. There are no palpable masses. There is no hepatosplenomegaly. There are normal bowel sounds in all quadrants. Rectal : There is a previous midline incision gluteal cleft w small area of induration in the sacral area c/w pilonidal disease. No obvious fluctuance or evidence of abscess MUSCULOSKELETAL: Normal muscle strength and tone. No cyanosis or edema.   SKIN: Turgor is good and there are no pathologic skin lesions or ulcers. NEUROLOGIC: Motor and sensation is grossly normal. Cranial nerves are grossly intact. PSYCH:  Oriented to person, place and time. Affect is normal.  Data Reviewed  I have personally reviewed the patient's imaging, laboratory findings and medical records.    Assessment/Plan Pilonidal disease. D/W the pt in detail about options.   antibiotic therapy versus incision and drainage in the operating room with delayed primary closure. Time she wishes to try the antibiotic therapy first before committing to surgical intervention.  She will see my partner Dr. Earlene Plateravis or Dr Excell Seltzerooper in 1 to 2 weeks to ensure that there is  no surgical need at that time. D/W the pt in detail and she understands    Sterling Bigiego Vollie Aaron, MD FACS General Surgeon 10/03/2017, 1:27 PM

## 2017-10-03 NOTE — Patient Instructions (Addendum)
Follow up after antibiotics completed with Dr Earlene Plateravis

## 2017-10-09 ENCOUNTER — Encounter: Payer: Self-pay | Admitting: Surgery

## 2017-10-09 ENCOUNTER — Ambulatory Visit (INDEPENDENT_AMBULATORY_CARE_PROVIDER_SITE_OTHER): Payer: BLUE CROSS/BLUE SHIELD | Admitting: Surgery

## 2017-10-09 ENCOUNTER — Telehealth: Payer: Self-pay | Admitting: *Deleted

## 2017-10-09 VITALS — BP 116/79 | HR 90 | Temp 97.5°F | Ht 64.0 in | Wt 152.6 lb

## 2017-10-09 DIAGNOSIS — L0591 Pilonidal cyst without abscess: Secondary | ICD-10-CM | POA: Insufficient documentation

## 2017-10-09 NOTE — Telephone Encounter (Signed)
Patient called and stated that the pilonidal cyst has came back and it is draining a brownish color, she has not had a fever but wants to know what else can be done.

## 2017-10-09 NOTE — Progress Notes (Signed)
Surgical Clinic Progress/Follow-up Note   HPI:  22 y.o. Female s/p excision of pilonidal cyst Michela Pitcher([place], 2011) with recurrence superior to the surgical site in 2012 and incision with drainage for pilonidal abscess in 2014 -- after which she says the lesion and drainage both resolved until a wound with clear non-purulent drainage erupted earlier this month, for which she presented to ED and was prescribed clindamycin -- presents to clinic today for follow-up evaluation of her recurrent pilonidal cyst. Patient reports she was scheduled for follow-up appointment next week, but requested an appointment today due to persistent clear non-purulent drainage from her wound despite antibiotics. She says her wound and drainage from her wound have not changed since she started taking clindamycin, and she denies significant sacrococcygeal pain, fever/chills, N/V, or diarrhea. She expresses she is interested in re-excision of her cyst and any associated sinus tracts if it offers even the possibility for resolution of her recurrent sacrococcygeal wounds. Patient otherwise reports she has 4 days left of her prescribed antibiotics.  Review of Systems:  Constitutional: denies any other weight loss, fever, chills, or sweats  Eyes: denies any other vision changes, history of eye injury  ENT: denies sore throat, hearing problems  Respiratory: denies shortness of breath, wheezing  Cardiovascular: denies chest pain, palpitations  Gastrointestinal: abdominal pain, N/V, and bowel function as per HPI Musculoskeletal: denies any other joint pains or cramps  Skin: Denies any other rashes or skin discolorations except as per HPI Neurological: denies any other headache, dizziness, weakness  Psychiatric: denies any other depression, anxiety  All other review of systems: otherwise negative   Vital Signs:  BP 116/79   Pulse 90   Temp (!) 97.5 F (36.4 C) (Temporal)   Ht 5\' 4"  (1.626 m)   Wt 152 lb 9.6 oz (69.2 kg)   BMI  26.19 kg/m    Physical Exam:  Constitutional:  -- Normal body habitus  -- Awake, alert, and oriented x3  Eyes:  -- Pupils equally round and reactive to light  -- No scleral icterus  Ear, nose, throat:  -- No jugular venous distension  -- No nasal drainage, bleeding Pulmonary:  -- No crackles -- Equal breath sounds bilaterally -- Breathing non-labored at rest Cardiovascular:  -- S1, S2 present  -- No pericardial rubs  Gastrointestinal:  -- Soft, nontender, non-distended, no guarding/rebound  -- No abdominal masses appreciated, pulsatile or otherwise  Musculoskeletal / Integumentary:  -- Wounds or skin discoloration: 7 mm raised sacrococcygeal wound without surrounding erythema or purulent drainage, painful only with pressure applied for attempted expression of drainage (clear, serous) -- Extremities: B/L UE and LE FROM, hands and feet warm, no edema  Neurologic:  -- Motor function: intact and symmetric  -- Sensation: intact and symmetric   Laboratory studies:  CBC Latest Ref Rng & Units 07/22/2015  WBC 3.6 - 11.0 K/uL 10.2  Hemoglobin 12.0 - 16.0 g/dL 16.114.0  Hematocrit 09.635.0 - 47.0 % 43.0  Platelets 150 - 440 K/uL 212   CMP Latest Ref Rng & Units 07/22/2015  Glucose 65 - 99 mg/dL 81  BUN 6 - 20 mg/dL 7  Creatinine 0.450.44 - 4.091.00 mg/dL 8.110.68  Sodium 914135 - 782145 mmol/L 138  Potassium 3.5 - 5.1 mmol/L 3.6  Chloride 101 - 111 mmol/L 108  CO2 22 - 32 mmol/L 23  Calcium 8.9 - 10.3 mg/dL 9.0  Total Protein 6.5 - 8.1 g/dL 7.5  Total Bilirubin 0.3 - 1.2 mg/dL 9.5(A0.1(L)  Alkaline Phos 38 - 126 U/L  84  AST 15 - 41 U/L 19  ALT 14 - 54 U/L 14   Imaging: no new pertinent imaging studies available for review   Assessment:  22 y.o. yo Female with a problem list including...  There are no active problems to display for this patient.   presents to clinic for follow-up evaluation of questionably infected recurrent pilonidal cyst without abscess.  Plan:   - complete antibiotics as  prescribed  - keep affected site clean and dry, though no lifting/activity restrictions at this time  - return to clinic as scheduled next week after completion of antibiotics  - instructed to call office if any questions or concerns  All of the above recommendations were discussed with the patient and her boyfriend/fiance, and all of patient's and spouse's questions were answered to their expressed satisfaction.  -- Scherrie Gerlach Earlene Plater, MD, RPVI [place]: [facility] General Surgery - Partnering for exceptional care. Office: [phone]

## 2017-10-09 NOTE — Telephone Encounter (Signed)
Pilonidal cyst in brown color, having pain. Denies fever, chills. Patient added to schedule today.

## 2017-10-09 NOTE — Patient Instructions (Signed)
Please finish the course of Antibiotic.  Please keep your scheduled appointment.    Pilonidal Cyst A pilonidal cyst is a fluid-filled sac. It forms beneath the skin near your tailbone, at the top of the crease of your buttocks. A pilonidal cyst that is not large or infected may not cause symptoms or problems. If the cyst becomes irritated or infected, it may fill with pus. This causes pain and swelling (pilonidal abscess). An infected cyst may need to be treated with medicine, drained, or removed. What are the causes? The cause of a pilonidal cyst is not known. One cause may be a hair that grows into your skin (ingrown hair). What increases the risk? Pilonidal cysts are more common in boys and men. Risk factors include:  Having lots of hair near the crease of the buttocks.  Being overweight.  Having a pilonidal dimple.  Wearing tight clothing.  Not bathing or showering frequently.  Sitting for long periods of time.  What are the signs or symptoms? Signs and symptoms of a pilonidal cyst may include:  Redness.  Pain and tenderness.  Warmth.  Swelling.  Pus.  Fever.  How is this diagnosed? Your health care provider may diagnose a pilonidal cyst based on your symptoms and a physical exam. The health care provider may do a blood test to check for infection. If your cyst is draining pus, your health care provider may take a sample of the drainage to be tested at a laboratory. How is this treated? Surgery is the usual treatment for an infected pilonidal cyst. You may also have to take medicines before surgery. The type of surgery you have depends on the size and severity of the infected cyst. The different kinds of surgery include:  Incision and drainage. This is a procedure to open and drain the cyst.  Marsupialization. In this procedure, a large cyst or abscess may be opened and kept open by stitching the edges of the skin to the cyst walls.  Cyst removal. This procedure  involves opening the skin and removing all or part of the cyst.  Follow these instructions at home:  Follow all of your surgeon's instructions carefully if you had surgery.  Take medicines only as directed by your health care provider.  If you were prescribed an antibiotic medicine, finish it all even if you start to feel better.  Keep the area around your pilonidal cyst clean and dry.  Clean the area as directed by your health care provider. Pat the area dry with a clean towel. Do not rub it as this may cause bleeding.  Remove hair from the area around the cyst as directed by your health care provider.  Do not wear tight clothing or sit in one place for long periods of time.  There are many different ways to close and cover an incision, including stitches, skin glue, and adhesive strips. Follow your health care provider's instructions on: ? Incision care. ? Bandage (dressing) changes and removal. ? Incision closure removal. Contact a health care provider if:  You have drainage, redness, swelling, or pain at the site of the cyst.  You have a fever. This information is not intended to replace advice given to you by your health care provider. Make sure you discuss any questions you have with your health care provider. Document Released: 01/28/2000 Document Revised: 07/08/2015 Document Reviewed: 06/19/2013 Elsevier Interactive Patient Education  2018 [employer]

## 2017-10-16 ENCOUNTER — Ambulatory Visit (INDEPENDENT_AMBULATORY_CARE_PROVIDER_SITE_OTHER): Payer: BLUE CROSS/BLUE SHIELD | Admitting: Surgery

## 2017-10-16 ENCOUNTER — Encounter: Payer: Self-pay | Admitting: Surgery

## 2017-10-16 VITALS — BP 106/72 | HR 84 | Temp 97.7°F | Wt 153.0 lb

## 2017-10-16 DIAGNOSIS — L0501 Pilonidal cyst with abscess: Secondary | ICD-10-CM

## 2017-10-16 NOTE — Patient Instructions (Signed)
Pilonidal Cyst A pilonidal cyst is a fluid-filled sac. It forms beneath the skin near your tailbone, at the top of the crease of your buttocks. A pilonidal cyst that is not large or infected may not cause symptoms or problems. If the cyst becomes irritated or infected, it may fill with pus. This causes pain and swelling (pilonidal abscess). An infected cyst may need to be treated with medicine, drained, or removed. What are the causes? The cause of a pilonidal cyst is not known. One cause may be a hair that grows into your skin (ingrown hair). What increases the risk? Pilonidal cysts are more common in boys and men. Risk factors include:  Having lots of hair near the crease of the buttocks.  Being overweight.  Having a pilonidal dimple.  Wearing tight clothing.  Not bathing or showering frequently.  Sitting for long periods of time.  What are the signs or symptoms? Signs and symptoms of a pilonidal cyst may include:  Redness.  Pain and tenderness.  Warmth.  Swelling.  Pus.  Fever.  How is this diagnosed? Your health care provider may diagnose a pilonidal cyst based on your symptoms and a physical exam. The health care provider may do a blood test to check for infection. If your cyst is draining pus, your health care provider may take a sample of the drainage to be tested at a laboratory. How is this treated? Surgery is the usual treatment for an infected pilonidal cyst. You may also have to take medicines before surgery. The type of surgery you have depends on the size and severity of the infected cyst. The different kinds of surgery include:  Incision and drainage. This is a procedure to open and drain the cyst.  Marsupialization. In this procedure, a large cyst or abscess may be opened and kept open by stitching the edges of the skin to the cyst walls.  Cyst removal. This procedure involves opening the skin and removing all or part of the cyst.  Follow these  instructions at home:  Follow all of your surgeon's instructions carefully if you had surgery.  Take medicines only as directed by your health care provider.  If you were prescribed an antibiotic medicine, finish it all even if you start to feel better.  Keep the area around your pilonidal cyst clean and dry.  Clean the area as directed by your health care provider. Pat the area dry with a clean towel. Do not rub it as this may cause bleeding.  Remove hair from the area around the cyst as directed by your health care provider.  Do not wear tight clothing or sit in one place for long periods of time.  There are many different ways to close and cover an incision, including stitches, skin glue, and adhesive strips. Follow your health care provider's instructions on: ? Incision care. ? Bandage (dressing) changes and removal. ? Incision closure removal. Contact a health care provider if:  You have drainage, redness, swelling, or pain at the site of the cyst.  You have a fever. This information is not intended to replace advice given to you by your health care provider. Make sure you discuss any questions you have with your health care provider. Document Released: 01/28/2000 Document Revised: 07/08/2015 Document Reviewed: 06/19/2013 Elsevier Interactive Patient Education  2018 [employer] Inc.  

## 2017-10-16 NOTE — Progress Notes (Signed)
Surgical Clinic Progress/Follow-up Note   HPI:  22 y.o. Female presents to clinic for follow-up evaluation of her chronic recurrent and recently infected pilonidal cyst. Patient reports she completed the antibiotics she was prescribed and has not since noted any significant pain, purulent drainage, or fever/chills, despite a small amount of clear drainage on dry gauze she's been applying to protect her underwear/clothes.  Review of Systems:  Constitutional: denies any other weight loss, fever, chills, or sweats  Eyes: denies any other vision changes, history of eye injury  ENT: denies sore throat, hearing problems  Respiratory: denies shortness of breath, wheezing  Cardiovascular: denies chest pain, palpitations  Gastrointestinal: denies abdominal pain, N/V, or diarrhea Musculoskeletal: denies any other joint pains or cramps  Skin: Denies any other rashes or skin discolorations except as per HPI Neurological: denies any other headache, dizziness, weakness  Psychiatric: denies any other depression, anxiety  All other review of systems: otherwise negative   Vital Signs:  BP 106/72   Pulse 84   Temp 97.7 F (36.5 C) (Oral)   Wt 153 lb (69.4 kg)   BMI 26.26 kg/m    Physical Exam:  Constitutional:  -- Normal body habitus  -- Awake, alert, and oriented x3  Eyes:  -- Pupils equally round and reactive to light  -- No scleral icterus  Ear, nose, throat:  -- No jugular venous distension  -- No nasal drainage, bleeding Pulmonary:  -- No crackles -- Equal breath sounds bilaterally -- Breathing non-labored at rest Cardiovascular:  -- S1, S2 present  -- No pericardial rubs  Gastrointestinal:  -- Soft, nontender, non-distended, no guarding/rebound  -- No abdominal masses appreciated, pulsatile or otherwise  Musculoskeletal / Integumentary:  -- Wounds or skin discoloration: small, raised, non-tender, non-erythematous sacrococcygeal lesion with minimal serous non-purulent drainage  superior to well-healed vertical midline linear scar -- Extremities: B/L UE and LE FROM, hands and feet warm, no edema  Neurologic:  -- Motor function: intact and symmetric  -- Sensation: intact and symmetric   Laboratory studies:  CBC:  Lab Results  Component Value Date   WBC 10.2 07/22/2015   RBC 5.18 07/22/2015   BMP:  Lab Results  Component Value Date   GLUCOSE 81 07/22/2015   CO2 23 07/22/2015   BUN 7 07/22/2015   CREATININE 0.68 07/22/2015   CALCIUM 9.0 07/22/2015     Imaging: No new pertinent imaging studies available for review   Assessment:  22 y.o. yo Female with a problem list including...  Patient Active Problem List   Diagnosis Date Noted  . Chronic recurrent pilonidal cyst without abscess 10/09/2017    presents to clinic for follow-up evaluation of patient's currently minimally to asymptomatic recently infected recurrent pilonidal cyst, doing overall well s/p completion of prescribed antibiotics.  Plan:   - keep affected site clean and dry   - may apply dry gauze to protect underwear/clothing from drainage  - all risks, benefits, and alternatives to re-excision of recurrent recently infected pilonidal cyst were discussed at significant length with the patient and her boyfriend/fiance, all of their questions were answered to their expressed satisfaction, patient expresses she does not currently wish to proceed with surgery, but she anticipates she will at some point.  - return to clinic as needed, instructed to call office if any questions or concerns  All of the above recommendations were discussed with the patient and patient's family, and all of patient's and family's questions were answered to their expressed satisfaction.  -- Scherrie Gerlach  Earlene Plateravis, MD, RPVI [place]: [facility] General Surgery - Partnering for exceptional care. Office: [phone]

## 2017-10-17 ENCOUNTER — Encounter: Payer: Self-pay | Admitting: Surgery

## 2017-12-25 ENCOUNTER — Encounter: Payer: Self-pay | Admitting: Emergency Medicine

## 2017-12-25 ENCOUNTER — Emergency Department: Payer: BLUE CROSS/BLUE SHIELD

## 2017-12-25 DIAGNOSIS — Z79899 Other long term (current) drug therapy: Secondary | ICD-10-CM | POA: Insufficient documentation

## 2017-12-25 DIAGNOSIS — M25562 Pain in left knee: Secondary | ICD-10-CM | POA: Diagnosis present

## 2017-12-25 DIAGNOSIS — F17210 Nicotine dependence, cigarettes, uncomplicated: Secondary | ICD-10-CM | POA: Diagnosis not present

## 2017-12-25 DIAGNOSIS — M2392 Unspecified internal derangement of left knee: Secondary | ICD-10-CM | POA: Diagnosis not present

## 2017-12-25 NOTE — ED Triage Notes (Signed)
Pt c/o left knee pain since January when she injured the area falling at work. Pt denies medical treatment for issue until now. Pt able to ambulate but reports an increase in pain.

## 2017-12-26 ENCOUNTER — Emergency Department
Admission: EM | Admit: 2017-12-26 | Discharge: 2017-12-26 | Disposition: A | Discharge status: Home / Self Care | Payer: BLUE CROSS/BLUE SHIELD | Attending: Emergency Medicine | Admitting: Emergency Medicine

## 2017-12-26 DIAGNOSIS — M25562 Pain in left knee: Secondary | ICD-10-CM

## 2017-12-26 DIAGNOSIS — M2392 Unspecified internal derangement of left knee: Secondary | ICD-10-CM

## 2017-12-26 MED ORDER — IBUPROFEN 800 MG PO TABS
800.0000 mg | ORAL_TABLET | Freq: Once | ORAL | Status: AC
  Administered 2017-12-26: 800 mg via ORAL
  Filled 2017-12-26: qty 1

## 2017-12-26 MED ORDER — IBUPROFEN 800 MG PO TABS: 800.0000 mg | ORAL_TABLET | Freq: Three times a day (TID) | ORAL | 0 refills | Status: DC | PRN

## 2017-12-26 MED ORDER — HYDROCODONE-ACETAMINOPHEN 5-325 MG PO TABS: 1.0000 | ORAL_TABLET | Freq: Four times a day (QID) | ORAL | 0 refills | Status: DC | PRN

## 2017-12-26 NOTE — Discharge Instructions (Signed)
1.  You may take pain medicines as needed (Motrin/Norco #15). 2.  You may wear knee immobilizer as needed for comfort. 3.  Return to the ER for worsening symptoms, increased swelling, difficulty breathing or other concerns.

## 2017-12-26 NOTE — ED Notes (Signed)
Pt reports on-going left knee pain after a slip and fall injury in January of this year. Pt denies any recent injury or trauma.

## 2017-12-26 NOTE — ED Provider Notes (Signed)
[facility] Emergency Department Provider Note   ____________________________________________   First MD Initiated Contact with Patient 12/26/17 0221     (approximate)  I have reviewed the triage vital signs and the nursing notes.   HISTORY  Chief Complaint Knee Pain    HPI Lauren Cunningham is a 22 y.o. female who presents to the ED with acute on chronic left knee pain.  Patient slipped and fell, striking her left knee at work in January of this year.  Did not file Workmen's Comp.  Has had periodic pain which is exacerbated by the weather.  This week it has been especially cold and patient states her knee has been hurting more than usual.  Also feels like her knee "locks" when she ambulates on steps.  Denies extremity weakness, numbness or tingling.  Voices no other complaints.  Has not seen orthopedics.   Past Medical History:  Diagnosis Date  . Pilonidal cyst 2019    Patient Active Problem List   Diagnosis Date Noted  . Chronic recurrent pilonidal cyst without abscess 10/09/2017    Past Surgical History:  Procedure Laterality Date  . pilonidal cyst surgery  2011   Dr Michela Pitcher    Prior to Admission medications   Medication Sig Start Date End Date Taking? Authorizing Provider  HYDROcodone-acetaminophen (NORCO) 5-325 MG tablet Take 1 tablet by mouth every 6 (six) hours as needed for moderate pain. 12/26/17   Irean Hong, MD  ibuprofen (ADVIL,MOTRIN) 800 MG tablet Take 1 tablet (800 mg total) by mouth every 8 (eight) hours as needed for moderate pain. 12/26/17   Irean Hong, MD  medroxyPROGESTERone (DEPO-PROVERA) 150 MG/ML injection Inject 150 mg into the muscle every 3 (three) months.    [provider]    Allergies Patient has no known allergies.  History reviewed. No pertinent family history.  Social History Social History   Tobacco Use  . Smoking status: Current Every Day Smoker    Packs/day: 0.50    Years: 3.00    Pack years:  1.50    Types: Cigarettes  . Smokeless tobacco: Never Used  Substance Use Topics  . Alcohol use: Yes    Comment: rare  . Drug use: No    Review of Systems  Constitutional: No fever/chills Eyes: No visual changes. ENT: No sore throat. Cardiovascular: Denies chest pain. Respiratory: Denies shortness of breath. Gastrointestinal: No abdominal pain.  No nausea, no vomiting.  No diarrhea.  No constipation. Genitourinary: Negative for dysuria. Musculoskeletal: Positive for left knee pain.  Negative for back pain. Skin: Negative for rash. Neurological: Negative for headaches, focal weakness or numbness.   ____________________________________________   PHYSICAL EXAM:  VITAL SIGNS: ED Triage Vitals [12/25/17 2305]  Enc Vitals Group     BP 117/62     Pulse Rate 94     Resp 18     Temp 98 F (36.7 C)     Temp Source Oral     SpO2 100 %     Weight 160 lb (72.6 kg)     Height 5\' 3"  (1.6 m)     Head Circumference      Peak Flow      Pain Score      Pain Loc      Pain Edu?      Excl. in GC?     Constitutional: Alert and oriented. Well appearing and in no acute distress. Eyes: Conjunctivae are normal. PERRL. EOMI. Head: Atraumatic. Nose: No congestion/rhinnorhea.  Mouth/Throat: Mucous membranes are moist.  Oropharynx non-erythematous. Neck: No stridor.  No cervical spine tenderness to palpation. Cardiovascular: Normal rate, regular rhythm. Grossly normal heart sounds.  Good peripheral circulation. Respiratory: Normal respiratory effort.  No retractions. Lungs CTAB. Gastrointestinal: Soft and nontender. No distention. No abdominal bruits. No CVA tenderness. Musculoskeletal: Left anterior knee tender to palpation with minimal effusion.  Full range of motion with some pain.  2+ distal pulses.  Brisk, less than 5-second capillary refill. Neurologic:  Normal speech and language. No gross focal neurologic deficits are appreciated.  Skin:  Skin is warm, dry and intact. No rash  noted. Psychiatric: Mood and affect are normal. Speech and behavior are normal.  ____________________________________________   LABS (all labs ordered are listed, but only abnormal results are displayed)  Labs Reviewed - No data to display ____________________________________________  EKG  None ____________________________________________  RADIOLOGY  ED MD interpretation: No acute traumatic injury  Official radiology report(s): Dg Knee Complete 4 Views Left  Result Date: 12/25/2017 CLINICAL DATA:  Left knee pain since January after sustaining a fall at work. EXAM: LEFT KNEE - COMPLETE 4+ VIEW COMPARISON:  03/07/2017 FINDINGS: No evidence of fracture, dislocation, or joint effusion. No evidence of arthropathy or other focal bone abnormality. Soft tissues are unremarkable. IMPRESSION: No acute osseous abnormality to account for the patient's pain. Electronically Signed   By: Tollie Ethavid  Kwon M.D.   On: 12/25/2017 23:43    ____________________________________________   PROCEDURES  Procedure(s) performed: None  Procedures  Critical Care performed: No  ____________________________________________   INITIAL IMPRESSION / ASSESSMENT AND PLAN / ED COURSE  As part of my medical decision making, I reviewed the following data within the electronic MEDICAL RECORD NUMBER Nursing notes reviewed and incorporated, Radiograph reviewed and Notes from prior ED visits   22 year old female who presents with acute on chronic left knee pain status post injury in January. Will administer NSAIDs, knee immobilizer and follow up with orthopedics. Strict return precautions given. Patient verbalizes understanding and agrees with plan of care.      ____________________________________________   FINAL CLINICAL IMPRESSION(S) / ED DIAGNOSES  Final diagnoses:  Acute pain of left knee  Internal derangement of left knee     ED Discharge Orders         Ordered    ibuprofen (ADVIL,MOTRIN) 800 MG  tablet  Every 8 hours PRN     12/26/17 0244    HYDROcodone-acetaminophen (NORCO) 5-325 MG tablet  Every 6 hours PRN     12/26/17 0244           Note:  This document was prepared using Dragon voice recognition software and may include unintentional dictation errors.    Irean HongSung,  J, MD 12/26/17 [phone]

## 2018-03-13 LAB — HM HIV SCREENING LAB: HM HIV Screening: NEGATIVE

## 2018-09-11 ENCOUNTER — Other Ambulatory Visit: Payer: Self-pay

## 2018-09-11 ENCOUNTER — Ambulatory Visit: Payer: BLUE CROSS/BLUE SHIELD

## 2018-09-11 DIAGNOSIS — Z113 Encounter for screening for infections with a predominantly sexual mode of transmission: Secondary | ICD-10-CM

## 2018-09-11 DIAGNOSIS — N939 Abnormal uterine and vaginal bleeding, unspecified: Secondary | ICD-10-CM

## 2018-09-11 NOTE — Progress Notes (Signed)
Here for STD screen. Declines HIV/RPR.Aileen Fass, RN

## 2018-09-11 NOTE — Progress Notes (Signed)
    STI clinic/screening visit  Subjective:  Lauren Cunningham is a 23 y.o. female being seen today for an STI screening visit. The patient reports they do have symptoms.  Patient has the following medical conditions:   Patient Active Problem List   Diagnosis Date Noted  . Chronic recurrent pilonidal cyst without abscess 10/09/2017     Chief Complaint  Patient presents with  . SEXUALLY TRANSMITTED DISEASE    HPI  Patient reports continuous bleeding since menses on 08/17/18.  States that bleeding varies from spotting -heavy.  She has intermittent cramping.  Client was on Depo, she stopped several months ago- Resumed monthly menses February, 2020.  She is here for an STD screen  See flowsheet for further details and programmatic requirements.    The following portions of the patient's history were reviewed and updated as appropriate: allergies, current medications, past medical history, past social history, past surgical history and problem list.  Objective:  There were no vitals filed for this visit.  Physical Exam HENT:     Mouth/Throat:     Pharynx: Oropharynx is clear. No oropharyngeal exudate or posterior oropharyngeal erythema.  Genitourinary:    General: Normal vulva.     Rectum: Normal.     Comments: mod amt of bleeding noted, -CMT/adnexal tenderness. No lymphadenopathy Skin:    General: Skin is warm.     Findings: No lesion or rash.  Neurological:     Mental Status: She is alert.    Assessment and Plan:  Lauren Cunningham is a 23 y.o. female presenting to the [facility] Department for STI screening  1. Screening examination for venereal disease  - Chlamydia/Gonorrhea [place]. To use condoms for back-up and STD prevention.  2.  Bleeding - R/O infection -co. To take ibuprofen 600-800 mg q 8 hr for 1 wk.  If bleeding continues call for an appt. To be evaluated.    No follow-ups on file.  No future  appointments.  Hassell Done, FNP

## 2018-10-16 ENCOUNTER — Ambulatory Visit: Payer: Self-pay

## 2018-10-17 ENCOUNTER — Ambulatory Visit: Payer: BLUE CROSS/BLUE SHIELD | Admitting: Physician Assistant

## 2018-10-17 ENCOUNTER — Encounter: Payer: Self-pay | Admitting: Physician Assistant

## 2018-10-17 ENCOUNTER — Other Ambulatory Visit: Payer: Self-pay

## 2018-10-17 VITALS — BP 102/69 | Ht 63.0 in | Wt 164.8 lb

## 2018-10-17 DIAGNOSIS — Z113 Encounter for screening for infections with a predominantly sexual mode of transmission: Secondary | ICD-10-CM

## 2018-10-17 DIAGNOSIS — A5901 Trichomonal vulvovaginitis: Secondary | ICD-10-CM

## 2018-10-17 LAB — WET PREP FOR TRICH, YEAST, CLUE
Trichomonas Exam: POSITIVE — AB
Yeast Exam: NEGATIVE

## 2018-10-17 MED ORDER — METRONIDAZOLE 500 MG PO TABS: 2000.0000 mg | ORAL_TABLET | Freq: Once | ORAL | 0 refills | Status: AC

## 2018-10-17 NOTE — Progress Notes (Deleted)
Patient states she was here about 2 weeks ago for bleeding issues and STD testing. Here today for follow-up and to complete testing.Jenetta Downer, RN

## 2018-10-17 NOTE — Progress Notes (Signed)
Patient wet mount reviewed, treated for trich per SO. MVI given, and pamphlet given for planning a healthy pregnancy.Jenetta Downer, RN

## 2018-10-17 NOTE — Progress Notes (Signed)
Patient here for follow-up. Had 09/11/18 appointment for bleeding issues and STD screen. Patient states she stopped bleeding about 2 weeks ago and is here to complete STD testing.Jenetta Downer, RN

## 2018-10-17 NOTE — Progress Notes (Signed)
S: Pt presents to complete her STI visit from late July. Was having persistent vaginal bleeding then and did not get wet prep. Also did not have bloodwork at that visit and wants HIV and syphilis testing today. GC/Chlam from that visit are both neg. Denies sx. LMP 08/17/18, but lasted til 09/25/18. Last sex 2 weeks ago with condom. O: Pleasant woman in NAD. Pelvic exam with sm amt physiologic vaginal discharge without odor, vag pH <4.5. No vaginal erythema or lesions, no cervical friability. Bimanual exam nontender without masses. Wet prep pos Trich A/P: 1. Completed STD screen from prior visit - await HIV/syphilis results 2. Trich vaginalis - treat per S.O.

## 2018-10-24 ENCOUNTER — Encounter: Payer: Self-pay | Admitting: Physician Assistant

## 2018-11-18 ENCOUNTER — Other Ambulatory Visit: Payer: Self-pay

## 2018-11-18 ENCOUNTER — Encounter: Payer: Self-pay | Admitting: Physician Assistant

## 2018-11-18 ENCOUNTER — Ambulatory Visit: Payer: Self-pay | Admitting: Physician Assistant

## 2018-11-18 VITALS — BP 120/83 | Ht 64.0 in | Wt 159.0 lb

## 2018-11-18 DIAGNOSIS — Z113 Encounter for screening for infections with a predominantly sexual mode of transmission: Secondary | ICD-10-CM

## 2018-11-18 DIAGNOSIS — Z202 Contact with and (suspected) exposure to infections with a predominantly sexual mode of transmission: Secondary | ICD-10-CM

## 2018-11-18 LAB — WET PREP FOR TRICH, YEAST, CLUE
Trichomonas Exam: NEGATIVE
Yeast Exam: NEGATIVE

## 2018-11-18 LAB — PREGNANCY, URINE: Preg Test, Ur: NEGATIVE

## 2018-11-18 MED ORDER — ACYCLOVIR 400 MG PO TABS
400.0000 mg | ORAL_TABLET | Freq: Three times a day (TID) | ORAL | 0 refills | Status: AC
Start: 2018-11-18 — End: 2018-11-28

## 2018-11-18 NOTE — Progress Notes (Signed)
STI clinic/screening visit  Subjective:  Lauren Cunningham is a 23 y.o. female being seen today for an STI screening visit. The patient reports they do have symptoms.  Patient has the following medical conditions:   Patient Active Problem List   Diagnosis Date Noted  . Chronic recurrent pilonidal cyst without abscess 10/09/2017     Chief Complaint  Patient presents with  . SEXUALLY TRANSMITTED DISEASE    HPI  Patient reports that she has "lumps at the top of my legs" that are painful for the last 2 days. Also, has a cut that was a "bump" on Friday that is in the vaginal area.  Denies other symptoms.  States that she has had some irregular bleeding since d/c Depo, with LMP 11/03/18.  See flowsheet for further details and programmatic requirements.    The following portions of the patient's history were reviewed and updated as appropriate: allergies, current medications, past medical history, past social history, past surgical history and problem list.  Objective:   Vitals:   11/18/18 0900  BP: 120/83  Weight: 159 lb (72.1 kg)  Height: 5\' 4"  (1.626 m)    Physical Exam Constitutional:      General: She is not in acute distress.    Appearance: Normal appearance.  HENT:     Head: Normocephalic and atraumatic.     Mouth/Throat:     Mouth: Mucous membranes are moist.     Pharynx: Oropharynx is clear. No oropharyngeal exudate or posterior oropharyngeal erythema.  Eyes:     Conjunctiva/sclera: Conjunctivae normal.  Neck:     Musculoskeletal: Neck supple.  Pulmonary:     Effort: Pulmonary effort is normal.  Abdominal:     Palpations: Abdomen is soft. There is no mass.     Tenderness: There is no abdominal tenderness. There is no guarding or rebound.  Genitourinary:    General: Normal vulva.     Rectum: Normal.     Comments: External genitalia/pubic area without nits, lice, edema, erythema, or lesions. Shotty bilateral inguinal adenopathy. Lower, inner labia minora on  R side with small, tender, ulcerative lesion. Vagina with normal mucosa and small amount of menstrual bleeding. Cervix without visible lesions. Uterus firm, mobile, nt, no masses, no CMT, no adnexal tenderness or fullness. Exam difficult due to patient tensing during exam.  Lymphadenopathy:     Cervical: No cervical adenopathy.  Skin:    General: Skin is warm and dry.     Findings: No bruising, erythema, lesion or rash.  Neurological:     Mental Status: She is alert and oriented to person, place, and time.  Psychiatric:        Mood and Affect: Mood normal.        Behavior: Behavior normal.        Thought Content: Thought content normal.        Judgment: Judgment normal.       Assessment and Plan:  Lauren Cunningham is a 23 y.o. female presenting to the [facility] Department for STI screening  1. Screening for STD (sexually transmitted disease) Patient with symptoms today.  Declines blood work today due to having had it about 1 month ago. No sex until lesion clears and test results are back. Rec condoms with all sex. Await test results.  Counseled that RN will call if needs to RTC for further treatment once results are back. Brief counseling re:  HSV and tx options.  Patient opts to take Acyclovir 400  mg #30 1 po TID for 10 days for possible HSV initial outbreak. - Pregnancy, urine - WET PREP FOR TRICH, YEAST, CLUE - Virology, [facility] - acyclovir (ZOVIRAX) 400 MG tablet; Take 1 tablet (400 mg total) by mouth 3 (three) times daily for 10 days.  Dispense: 30 tablet; Refill: 0     Return in 3 weeks (on 12/09/2018), or if symptoms worsen or fail to improve, for Test results.  Future Appointments  Date Time Provider Department Center  12/09/2018  9:00 AM [facility]I NURSE [facility] None    Matt Holmes, [place]

## 2018-11-18 NOTE — Progress Notes (Signed)
UPT is negative today. Wet mount reviewed and no treatment needed for wet mount per Antoine Primas, PA verbal order. Pt received Acyclovir #30 per Antoine Primas, PA order. Provider orders completed.Ronny Bacon, RN

## 2018-11-27 ENCOUNTER — Telehealth: Payer: Self-pay

## 2018-11-29 ENCOUNTER — Ambulatory Visit: Payer: Self-pay

## 2018-11-29 ENCOUNTER — Other Ambulatory Visit: Payer: Self-pay

## 2018-11-29 DIAGNOSIS — A549 Gonococcal infection, unspecified: Secondary | ICD-10-CM

## 2018-11-29 MED ORDER — AZITHROMYCIN 500 MG PO TABS
1000.0000 mg | ORAL_TABLET | Freq: Once | ORAL | Status: AC
Start: 2018-11-29 — End: 2018-11-29
  Administered 2018-11-29: 1000 mg via ORAL

## 2018-11-29 MED ORDER — CEFTRIAXONE SODIUM 250 MG IJ SOLR
250.0000 mg | Freq: Once | INTRAMUSCULAR | Status: AC
  Administered 2018-11-29: 250 mg via INTRAMUSCULAR
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
## 2018-11-29 NOTE — Progress Notes (Signed)
Tx'd for GC per SO. Tolerated well Kilea Mccarey, RN  

## 2018-11-29 NOTE — Telephone Encounter (Signed)
Returning phone call regarding TR

## 2018-12-03 ENCOUNTER — Other Ambulatory Visit: Payer: Self-pay

## 2018-12-03 ENCOUNTER — Ambulatory Visit: Payer: Self-pay

## 2018-12-03 DIAGNOSIS — A549 Gonococcal infection, unspecified: Secondary | ICD-10-CM

## 2018-12-03 MED ORDER — CEFTRIAXONE SODIUM 250 MG IJ SOLR
250.0000 mg | Freq: Once | INTRAMUSCULAR | Status: AC
  Administered 2018-12-03: 12:00:00 250 mg via INTRAMUSCULAR

## 2018-12-03 MED ORDER — AZITHROMYCIN 500 MG PO TABS
1000.0000 mg | ORAL_TABLET | Freq: Once | ORAL | Status: AC
  Administered 2018-12-03: 1000 mg via ORAL
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
## 2018-12-04 NOTE — Addendum Note (Signed)
Addended by: Antoine Primas on: 12/04/2018 10:08 AM   Modules accepted: Orders

## 2018-12-05 ENCOUNTER — Telehealth: Payer: Self-pay

## 2018-12-05 NOTE — Addendum Note (Signed)
Addended by: Cletis Media on: 12/05/2018 10:55 AM   Modules accepted: Orders

## 2018-12-05 NOTE — Telephone Encounter (Signed)
TC to patient. Verified ID via password. Patient reports tolerated treatment this week well and kept meds down.  Also discussed HSV lab error- explained that state lab will not have any results. Patient reports all bumps are healed completely. Instructed in the even that she notices bumps/lesions again to please come into [facility] for testing. Patient verbalized understanding. Aileen Fass, RN

## 2018-12-09 ENCOUNTER — Other Ambulatory Visit: Payer: Self-pay

## 2018-12-26 NOTE — Addendum Note (Signed)
Addended by: Cletis Media on: 12/26/2018 08:35 AM   Modules accepted: Orders

## 2019-01-22 ENCOUNTER — Other Ambulatory Visit: Payer: Self-pay

## 2019-01-22 ENCOUNTER — Ambulatory Visit: Payer: Self-pay

## 2019-01-29 ENCOUNTER — Other Ambulatory Visit: Payer: Self-pay

## 2019-01-29 ENCOUNTER — Encounter: Payer: Self-pay | Admitting: Advanced Practice Midwife

## 2019-01-29 ENCOUNTER — Ambulatory Visit (LOCAL_COMMUNITY_HEALTH_CENTER): Payer: Self-pay | Admitting: Advanced Practice Midwife

## 2019-01-29 VITALS — BP 109/74 | Ht 64.0 in | Wt 156.0 lb

## 2019-01-29 DIAGNOSIS — Z113 Encounter for screening for infections with a predominantly sexual mode of transmission: Secondary | ICD-10-CM

## 2019-01-29 DIAGNOSIS — T7411XA Adult physical abuse, confirmed, initial encounter: Secondary | ICD-10-CM | POA: Insufficient documentation

## 2019-01-29 DIAGNOSIS — B9689 Other specified bacterial agents as the cause of diseases classified elsewhere: Secondary | ICD-10-CM

## 2019-01-29 DIAGNOSIS — N760 Acute vaginitis: Secondary | ICD-10-CM

## 2019-01-29 DIAGNOSIS — Z8619 Personal history of other infectious and parasitic diseases: Secondary | ICD-10-CM | POA: Insufficient documentation

## 2019-01-29 DIAGNOSIS — F17200 Nicotine dependence, unspecified, uncomplicated: Secondary | ICD-10-CM | POA: Insufficient documentation

## 2019-01-29 DIAGNOSIS — F1290 Cannabis use, unspecified, uncomplicated: Secondary | ICD-10-CM | POA: Insufficient documentation

## 2019-01-29 LAB — WET PREP FOR TRICH, YEAST, CLUE
Trichomonas Exam: NEGATIVE
Yeast Exam: NEGATIVE

## 2019-01-29 LAB — PREGNANCY, URINE: Preg Test, Ur: NEGATIVE

## 2019-01-29 MED ORDER — METRONIDAZOLE 500 MG PO TABS: 500.0000 mg | ORAL_TABLET | Freq: Two times a day (BID) | ORAL | 0 refills | Status: AC

## 2019-01-29 MED ORDER — MULTIVITAMINS PO CAPS: 1.0000 | ORAL_CAPSULE | Freq: Every day | ORAL | 0 refills | Status: AC

## 2019-01-29 NOTE — Progress Notes (Signed)
Here today for STD screening and PT. Last PE with Pap Smear (NIL) was 04/05/2017. Patient tearful and states she is not interested in birth control. Hal Morales, RN
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
## 2019-01-29 NOTE — Progress Notes (Addendum)
[employer] results reviewed. Patient treated for BV per standing orders. MVI's given. Family [facility], [facility] and LCSW information given. Hal Morales, RN

## 2019-01-30 ENCOUNTER — Emergency Department: Payer: Self-pay

## 2019-01-30 ENCOUNTER — Encounter: Payer: Self-pay | Admitting: Emergency Medicine

## 2019-01-30 ENCOUNTER — Other Ambulatory Visit: Payer: Self-pay

## 2019-01-30 ENCOUNTER — Emergency Department
Admission: EM | Admit: 2019-01-30 | Discharge: 2019-01-30 | Disposition: A | Discharge status: Home / Self Care | Payer: Self-pay | Attending: Emergency Medicine | Admitting: Emergency Medicine

## 2019-01-30 DIAGNOSIS — F17210 Nicotine dependence, cigarettes, uncomplicated: Secondary | ICD-10-CM | POA: Insufficient documentation

## 2019-01-30 DIAGNOSIS — Y92019 Unspecified place in single-family (private) house as the place of occurrence of the external cause: Secondary | ICD-10-CM | POA: Insufficient documentation

## 2019-01-30 DIAGNOSIS — E010 Iodine-deficiency related diffuse (endemic) goiter: Secondary | ICD-10-CM | POA: Insufficient documentation

## 2019-01-30 DIAGNOSIS — Y9389 Activity, other specified: Secondary | ICD-10-CM | POA: Insufficient documentation

## 2019-01-30 DIAGNOSIS — S1083XA Contusion of other specified part of neck, initial encounter: Secondary | ICD-10-CM | POA: Insufficient documentation

## 2019-01-30 DIAGNOSIS — Y999 Unspecified external cause status: Secondary | ICD-10-CM | POA: Insufficient documentation

## 2019-01-30 DIAGNOSIS — S1093XA Contusion of unspecified part of neck, initial encounter: Secondary | ICD-10-CM

## 2019-01-30 LAB — CBC
HCT: 45 % (ref 36.0–46.0)
Hemoglobin: 14.2 g/dL (ref 12.0–15.0)
MCH: 27.7 pg (ref 26.0–34.0)
MCHC: 31.6 g/dL (ref 30.0–36.0)
MCV: 87.9 fL (ref 80.0–100.0)
Platelets: 223 10*3/uL (ref 150–400)
RBC: 5.12 MIL/uL — ABNORMAL HIGH (ref 3.87–5.11)
RDW: 14 % (ref 11.5–15.5)
WBC: 14.6 10*3/uL — ABNORMAL HIGH (ref 4.0–10.5)
nRBC: 0 % (ref 0.0–0.2)

## 2019-01-30 LAB — COMPREHENSIVE METABOLIC PANEL
ALT: 14 U/L (ref 0–44)
AST: 21 U/L (ref 15–41)
Albumin: 4.3 g/dL (ref 3.5–5.0)
Alkaline Phosphatase: 74 U/L (ref 38–126)
Anion gap: 10 (ref 5–15)
BUN: 7 mg/dL (ref 6–20)
CO2: 22 mmol/L (ref 22–32)
Calcium: 9.4 mg/dL (ref 8.9–10.3)
Chloride: 109 mmol/L (ref 98–111)
Creatinine, Ser: 0.71 mg/dL (ref 0.44–1.00)
GFR calc Af Amer: >60 mL/min (ref >60)
GFR calc non Af Amer: >60 mL/min (ref >60)
Glucose, Bld: 98 mg/dL (ref 70–99)
Potassium: 3.6 mmol/L (ref 3.5–5.1)
Sodium: 141 mmol/L (ref 135–145)
Total Bilirubin: 0.7 mg/dL (ref 0.3–1.2)
Total Protein: 7.8 g/dL (ref 6.5–8.1)

## 2019-01-30 LAB — POCT PREGNANCY, URINE: Preg Test, Ur: NEGATIVE

## 2019-01-30 LAB — TSH: TSH: 1.02 u[IU]/mL (ref 0.350–4.500)

## 2019-01-30 MED ORDER — IOHEXOL 350 MG/ML SOLN
75.0000 mL | Freq: Once | INTRAVENOUS | Status: AC | PRN
  Administered 2019-01-30: 75 mL via INTRAVENOUS
  Filled 2019-01-30: qty 75

## 2019-01-30 NOTE — ED Notes (Signed)
SANE RN at bedside.
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
## 2019-01-30 NOTE — Discharge Instructions (Signed)
° ° ° ° °  Interpersonal Violence  ° °Interpersonal Violence aka Domestic Violence is defined as violence between people who have had a personal relationship. For example, someone you have ever dated, been married to or in a domestic partnership with. Someone with whom you have a child in common, or a current  household member.  °Does one or more of the following  °• attempts to cause bodily injury, or intentionally causes bodily injury; °• places you or a member of your family or household in fear of imminent serious bodily injury; °• continued harassment that rises to such a level as to inflict substantial emotional distress; or °• commits any rape or sexual offense  °You are not alone. Unfortunately domestic violence is very common. Domestic violence does not go away on its own and tends to get worse over time and more frequent. There are people who can help. There are resources included in these instructions. °Evidence can be collected in case you want to notify law enforcement now or in the future. A forensic nurse can take photographs and create a medical/legal document of the incident. If you choose to report to law enforcement, they will request a copy of the chart which we can provide with your permission. We can call in social work or an advocate to help with safety planning and emergency placement in a shelter if you have no other safe options. ° °THE POLICE CAN HELP YOU:  °• Get to a safe place away from the violence.  °• Get information on how the court can help protect you against the violence.  °• Get necessary belongings from your home for you and your children.  °• Get copies of police reports about the violence.  °• File a complaint in criminal court.  °• Find where local criminal and family courts are located. ° ° °The [facility] Can Help You °• Safety Planning °• Assistance with Shelter °• Obtaining a Protective Order (50B) °• Court Advocacy and Accompaniment °• Support Group °• Legal  Assistance °• Assistance with domestic violence related criminal charges °• Child Protective Services °• Supervised Visitation °• Financial Assistance Enrollment °• Job Readiness °• Budget Counseling  °• Coaching and Mentoring ° °Call your local domestic violence program for additional information and support.  ° °[facility] [place] [facility]   336-641-SAFE °Crisis Line 336-273-7273 °[facility] [place] [facility]   336-570-6019 °Crisis Line 336-226-5985 °[facility] [place] 919-542-0475 ° °National Domestic Violence Abuse Hotline  °1-800-799-7233 ° ° ° ° ° °

## 2019-01-30 NOTE — ED Notes (Signed)
ED Provider at bedside. 

## 2019-01-30 NOTE — SANE Note (Signed)
On 01/30/2019, at approximately 1315 hours, the SANE/FNE Naval architect) consult was completed. The primary RN and physician were notified. Please contact the SANE/FNE nurse on call (listed in [employer]) with any further concerns.

## 2019-01-30 NOTE — SANE Note (Signed)
On 01/30/2019, at approximately 0938 hours, I spoke with the patient and advised her what services were available to her.    The patient consented to speaking with someone from the [facility] [phone]), about potential housing options, and they were contacted on her behalf.  The patient was advised that I would be in to see her in approximately 1.00 to 1.50 hours.

## 2019-01-30 NOTE — ED Notes (Signed)
Community resources to [employer] give to pt

## 2019-01-30 NOTE — ED Notes (Signed)
On call SANE nurse paged ,

## 2019-01-30 NOTE — SANE Note (Addendum)
Follow-up Phone Call  Patient gives verbal consent for a FNE/SANE follow-up phone call in 48-72 hours: DID NOT ASK THE PT. Patient's telephone number: [phone] (PT'S CELL W/ TEXTING; VM NOT SET UP). Patient gives verbal consent to leave voicemail at the phone number listed above: DID NOT ASK THE PT. DO NOT CALL between the hours of: N/A  PT'S MAILING ADDRESS:  [address], [place], [place] [phone] (HER GRANDPARENTS' [place]).   [email or web address]
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
## 2019-03-17 ENCOUNTER — Emergency Department
Admission: EM | Admit: 2019-03-17 | Discharge: 2019-03-17 | Disposition: A | Discharge status: Home / Self Care | Payer: Self-pay | Attending: Emergency Medicine | Admitting: Emergency Medicine

## 2019-03-17 ENCOUNTER — Encounter: Payer: Self-pay | Admitting: Emergency Medicine

## 2019-03-17 ENCOUNTER — Other Ambulatory Visit: Payer: Self-pay

## 2019-03-17 DIAGNOSIS — Z79899 Other long term (current) drug therapy: Secondary | ICD-10-CM | POA: Insufficient documentation

## 2019-03-17 DIAGNOSIS — K0889 Other specified disorders of teeth and supporting structures: Secondary | ICD-10-CM | POA: Insufficient documentation

## 2019-03-17 DIAGNOSIS — F17210 Nicotine dependence, cigarettes, uncomplicated: Secondary | ICD-10-CM | POA: Insufficient documentation

## 2019-03-17 LAB — POCT PREGNANCY, URINE: Preg Test, Ur: NEGATIVE

## 2019-03-17 MED ORDER — IBUPROFEN 600 MG PO TABS: 600.0000 mg | ORAL_TABLET | Freq: Three times a day (TID) | ORAL | 0 refills | Status: DC | PRN

## 2019-03-17 MED ORDER — TRAMADOL HCL 50 MG PO TABS
50.0000 mg | ORAL_TABLET | Freq: Once | ORAL | Status: AC
  Administered 2019-03-17: 50 mg via ORAL
  Filled 2019-03-17: qty 1

## 2019-03-17 MED ORDER — TRAMADOL HCL 50 MG PO TABS: 50.0000 mg | ORAL_TABLET | Freq: Four times a day (QID) | ORAL | 0 refills | Status: DC | PRN

## 2019-03-17 MED ORDER — AMOXICILLIN 875 MG PO TABS: 875.0000 mg | ORAL_TABLET | Freq: Two times a day (BID) | ORAL | 0 refills | Status: DC
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
## 2019-03-17 NOTE — ED Triage Notes (Signed)
Toothache to left side x several months.

## 2019-03-17 NOTE — ED Notes (Signed)
See triage note  Presents with left sided dental pain  Stats the pain started couple of months ago  States she has been taking OTC meds w/o much relief. States she thinks it may ben her wisdom teeth   Pain is moving to entire left side of face  Pt is tearful in room

## 2019-03-17 NOTE — Discharge Instructions (Signed)
Call one of the dental clinics listed on your discharge papers make an appointment for care of your dental pain.  Begin taking the amoxicillin twice a day for the next 10 days to help with any infection that may be causing your pain.  The tramadol should not be taken while you are driving as it could cause drowsiness.  Ibuprofen as needed for pain and inflammation.   Also the clinic at [facility] takes walk-in patients.  You will need to call and see what their hours are for people without an appointment.  OPTIONS FOR DENTAL FOLLOW UP CARE  [place] [facility] and [place] [email or web address] [phone])  [facility] [phone])  [place] [phone])  [place] [phone])  [facility] [phone]) This clinic caters to the indigent population and is on a lottery system. Location: [employer] [facility], [facility], [place], [place] Clinic Hours: Wednesdays from 6pm - 9pm, patients seen by a lottery system. For dates, call or go to [email or web address] Services: Cleanings, fillings and simple extractions. Payment Options: DENTAL WORK IS FREE OF CHARGE. Bring proof of income or support. Best way to get seen: Arrive at 5:15 pm - this is a lottery, NOT first come/first serve, so arriving earlier will not increase your chances of being seen.     [facility] [phone] Select option 1 for emergencies   Location: [facility], [place], [address] [place] [facility] Hours: No walk-ins accepted - call the day before to schedule an appointment. Check in times are 9:30 am and 1:30 pm. Services: Simple extractions, temporary fillings, pulpectomy/pulp debridement, uncomplicated abscess drainage. Payment  Options: PAYMENT IS DUE AT THE TIME OF SERVICE.  Fee is usually $100-200, additional surgical procedures (e.g. abscess drainage) may be extra. Cash, checks, Visa/MasterCard accepted.  Can file Medicaid if patient is covered for dental - patient should call case worker to check. No discount for [place] patients. Best way to get seen: MUST call the day before and get onto the schedule. Can usually be seen the next 1-2 days. No walk-ins accepted.     [place] [phone]   Location: [place], [place] [facility] Hours: M, W, Th, F 8am or 1:30pm, Tues 9a or 1:30 - first come/first served. Services: Simple extractions, temporary fillings, uncomplicated abscess drainage.  You do not need to be an [facility] resident. Payment Options: PAYMENT IS DUE AT THE TIME OF SERVICE. Dental insurance, otherwise sliding scale - bring proof of income or support. Depending on income and treatment needed, cost is usually $50-200. Best way to get seen: Arrive early as it is first come/first served.     [facility] [phone]   Location: [place] [facility] Hours: Mon-Thu 8a-5p Services: Most basic dental services including extractions and fillings. Payment Options: PAYMENT IS DUE AT THE TIME OF SERVICE. Sliding scale, up to 50% off - bring proof if income or support. Medicaid with dental option accepted. Best way to get seen: Call to schedule an appointment, can usually be seen within 2 weeks OR they will try to see walk-ins - show up at [place] or 2p (you may have to wait).     [facility] [place] RESIDENTS ONLY   Location: [facility] [place]iver  [address] [place], [place] [address] Clinic Hours: By appointment only. Monday - Thursday 8am-5pm, Friday 8am-12pm Services: Cleanings, fillings, extractions. Payment Options: PAYMENT IS  DUE AT THE TIME OF SERVICE. Cash, Visa or MasterCard. Sliding scale - $30 minimum per service. Best way to get seen: Come in to office, complete packet and make an appointment - need proof of income or support monies for each household member and proof of [facility] residence. Usually takes about a month to get in.     [facility] [phone]   Location: [address] [facility] Hours: Walk-in Urgent Care Dental Services are offered Monday-Friday mornings only. The numbers of emergencies accepted daily is limited to the number of providers available. Maximum 15 - Mondays, Wednesdays & Thursdays Maximum 10 - Tuesdays & Fridays Services: You do not need to be a [facility] resident to be seen for a dental emergency. Emergencies are defined as pain, swelling, abnormal bleeding, or dental trauma. Walkins will receive x-rays if needed. NOTE: Dental cleaning is not an emergency. Payment Options: PAYMENT IS DUE AT THE TIME OF SERVICE. Minimum co-pay is $40.00 for uninsured patients. Minimum co-pay is $3.00 for Medicaid with dental coverage. Dental Insurance is accepted and must be presented at time of visit. Medicare does not cover dental. Forms of payment: Cash, credit card, checks. Best way to get seen: If not previously registered with the clinic, walk-in dental registration begins at 7:15 am and is on a first come/first serve basis. If previously registered with the clinic, call to make an appointment.     [facility] [phone] [place] RESIDENTS ONLY   Location: [address], [place], [place] Clinic Hours: Mon-Thu 10a-2p Services: Extractions only! Payment Options: FREE (donations accepted) - bring proof of income or support Best way to get seen: Call and schedule an appointment OR come at 8am on the 1st Monday of every month (except for holidays) when it is first come/first served.     [facility] [phone]   Location: [address] [place], [place] Clinic Hours: Friday mornings Services, Payment Options, Best way to get seen: Call for info

## 2019-04-09 ENCOUNTER — Ambulatory Visit: Payer: Self-pay

## 2019-05-30 ENCOUNTER — Ambulatory Visit: Payer: Self-pay | Admitting: Physician Assistant

## 2019-05-30 ENCOUNTER — Encounter: Payer: Self-pay | Admitting: Physician Assistant

## 2019-05-30 ENCOUNTER — Other Ambulatory Visit: Payer: Self-pay

## 2019-05-30 DIAGNOSIS — Z3161 Procreative counseling and advice using natural family planning: Secondary | ICD-10-CM

## 2019-05-30 DIAGNOSIS — Z113 Encounter for screening for infections with a predominantly sexual mode of transmission: Secondary | ICD-10-CM

## 2019-05-30 LAB — WET PREP FOR TRICH, YEAST, CLUE
Trichomonas Exam: NEGATIVE
Yeast Exam: NEGATIVE

## 2019-05-30 MED ORDER — MULTI-VITAMIN/MINERALS PO TABS: 1.0000 | ORAL_TABLET | Freq: Every day | ORAL | 0 refills | Status: DC
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
## 2019-06-03 LAB — GONOCOCCUS CULTURE

## 2019-06-06 LAB — HM HEPATITIS C SCREENING LAB: HM Hepatitis Screen: NEGATIVE

## 2019-06-06 LAB — HM HIV SCREENING LAB: HM HIV Screening: NEGATIVE

## 2019-09-17 ENCOUNTER — Ambulatory Visit (LOCAL_COMMUNITY_HEALTH_CENTER): Payer: Medicaid Other | Admitting: Advanced Practice Midwife

## 2019-09-17 ENCOUNTER — Other Ambulatory Visit: Payer: Self-pay

## 2019-09-17 VITALS — Ht 64.0 in | Wt 128.2 lb

## 2019-09-17 DIAGNOSIS — Z3009 Encounter for other general counseling and advice on contraception: Secondary | ICD-10-CM | POA: Diagnosis not present

## 2019-09-17 DIAGNOSIS — F17200 Nicotine dependence, unspecified, uncomplicated: Secondary | ICD-10-CM

## 2019-09-17 DIAGNOSIS — T7411XS Adult physical abuse, confirmed, sequela: Secondary | ICD-10-CM

## 2019-09-17 DIAGNOSIS — Z3049 Encounter for surveillance of other contraceptives: Secondary | ICD-10-CM

## 2019-09-17 DIAGNOSIS — Z01419 Encounter for gynecological examination (general) (routine) without abnormal findings: Secondary | ICD-10-CM

## 2019-09-17 LAB — WET PREP FOR TRICH, YEAST, CLUE
Trichomonas Exam: NEGATIVE
Yeast Exam: NEGATIVE

## 2019-09-17 MED ORDER — MULTIVITAMINS PO CAPS: 1.0000 | ORAL_CAPSULE | Freq: Every day | ORAL | 0 refills | Status: DC

## 2019-09-17 NOTE — Progress Notes (Signed)
Wet mount reviewed, no tx per standing order. Per provider order, offered ECP; pt declined. Pt declined all forms of BC including condoms. Pt accepted MVI and [employer] booklet. Pt accepted LCSW business card. Provider orders completed.

## 2019-09-17 NOTE — Progress Notes (Signed)
Pt states she needs pap smear and physical.
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
## 2019-12-11 ENCOUNTER — Encounter: Payer: Self-pay | Admitting: Intensive Care

## 2019-12-11 ENCOUNTER — Other Ambulatory Visit: Payer: Self-pay

## 2019-12-11 ENCOUNTER — Emergency Department
Admission: EM | Admit: 2019-12-11 | Discharge: 2019-12-11 | Disposition: A | Discharge status: Home / Self Care | Payer: Self-pay | Attending: Emergency Medicine | Admitting: Emergency Medicine

## 2019-12-11 DIAGNOSIS — S0105XA Open bite of scalp, initial encounter: Secondary | ICD-10-CM | POA: Insufficient documentation

## 2019-12-11 DIAGNOSIS — W503XXA Accidental bite by another person, initial encounter: Secondary | ICD-10-CM | POA: Insufficient documentation

## 2019-12-11 DIAGNOSIS — S01352A Open bite of left ear, initial encounter: Secondary | ICD-10-CM | POA: Insufficient documentation

## 2019-12-11 DIAGNOSIS — Z23 Encounter for immunization: Secondary | ICD-10-CM | POA: Insufficient documentation

## 2019-12-11 DIAGNOSIS — F17210 Nicotine dependence, cigarettes, uncomplicated: Secondary | ICD-10-CM | POA: Insufficient documentation

## 2019-12-11 LAB — URINALYSIS, COMPLETE (UACMP) WITH MICROSCOPIC
Bilirubin Urine: NEGATIVE
Glucose, UA: NEGATIVE mg/dL
Hgb urine dipstick: NEGATIVE
Ketones, ur: NEGATIVE mg/dL
Leukocytes,Ua: NEGATIVE
Nitrite: NEGATIVE
Protein, ur: NEGATIVE mg/dL
Specific Gravity, Urine: 1.014 (ref 1.005–1.030)
pH: 7 (ref 5.0–8.0)

## 2019-12-11 LAB — POC URINE PREG, ED: Preg Test, Ur: NEGATIVE

## 2019-12-11 MED ORDER — AMOXICILLIN-POT CLAVULANATE 875-125 MG PO TABS: 1.0000 | ORAL_TABLET | Freq: Two times a day (BID) | ORAL | 0 refills | Status: AC

## 2019-12-11 MED ORDER — TETANUS-DIPHTH-ACELL PERTUSSIS 5-2.5-18.5 LF-MCG/0.5 IM SUSY
0.5000 mL | PREFILLED_SYRINGE | Freq: Once | INTRAMUSCULAR | Status: AC
  Administered 2019-12-11: 0.5 mL via INTRAMUSCULAR
  Filled 2019-12-11: qty 0.5

## 2019-12-11 MED ORDER — TRAMADOL HCL 50 MG PO TABS: 50.0000 mg | ORAL_TABLET | Freq: Two times a day (BID) | ORAL | 0 refills | Status: AC

## 2019-12-11 MED ORDER — AMOXICILLIN-POT CLAVULANATE 875-125 MG PO TABS
1.0000 | ORAL_TABLET | Freq: Once | ORAL | Status: AC
  Administered 2019-12-11: 1 via ORAL
  Filled 2019-12-11: qty 1
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
## 2019-12-24 ENCOUNTER — Emergency Department: Payer: PRIVATE HEALTH INSURANCE

## 2019-12-24 ENCOUNTER — Emergency Department
Admission: EM | Admit: 2019-12-24 | Discharge: 2019-12-24 | Disposition: A | Discharge status: Home / Self Care | Payer: PRIVATE HEALTH INSURANCE | Attending: Emergency Medicine | Admitting: Emergency Medicine

## 2019-12-24 ENCOUNTER — Encounter: Payer: Self-pay | Admitting: Emergency Medicine

## 2019-12-24 ENCOUNTER — Other Ambulatory Visit: Payer: Self-pay

## 2019-12-24 DIAGNOSIS — S8992XA Unspecified injury of left lower leg, initial encounter: Secondary | ICD-10-CM | POA: Insufficient documentation

## 2019-12-24 DIAGNOSIS — W228XXA Striking against or struck by other objects, initial encounter: Secondary | ICD-10-CM | POA: Insufficient documentation

## 2019-12-24 DIAGNOSIS — F17210 Nicotine dependence, cigarettes, uncomplicated: Secondary | ICD-10-CM | POA: Insufficient documentation

## 2019-12-24 MED ORDER — KETOROLAC TROMETHAMINE 10 MG PO TABS: 10.0000 mg | ORAL_TABLET | Freq: Four times a day (QID) | ORAL | 0 refills | Status: DC | PRN

## 2019-12-24 MED ORDER — IBUPROFEN 600 MG PO TABS: 600.0000 mg | ORAL_TABLET | Freq: Once | ORAL | Status: DC

## 2019-12-24 MED ORDER — KETOROLAC TROMETHAMINE 30 MG/ML IJ SOLN
30.0000 mg | Freq: Once | INTRAMUSCULAR | Status: AC
  Administered 2019-12-24: 30 mg via INTRAMUSCULAR
  Filled 2019-12-24: qty 1

## 2019-12-24 NOTE — ED Triage Notes (Signed)
Pt comes into the ED via POV c/o left knee pain after hitting her knee on the side of a metal conveyer belt at work.  Pt ambulatory to triage with no distress and hs knee brace in place.
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
## 2019-12-24 NOTE — ED Notes (Signed)
See triage note- pt reports L knee meniscus repair two years ago, yesterday she hit the same knee on a conveyer belt at work and has had pain and swelling of the L knee. Pt has knee brace in place.

## 2020-03-05 ENCOUNTER — Ambulatory Visit: Payer: Medicaid Other

## 2020-03-08 ENCOUNTER — Other Ambulatory Visit: Payer: Self-pay

## 2020-03-08 ENCOUNTER — Encounter: Payer: Self-pay | Admitting: Advanced Practice Midwife

## 2020-03-08 ENCOUNTER — Ambulatory Visit: Payer: Self-pay | Admitting: Advanced Practice Midwife

## 2020-03-08 DIAGNOSIS — Z113 Encounter for screening for infections with a predominantly sexual mode of transmission: Secondary | ICD-10-CM | POA: Diagnosis not present

## 2020-03-08 LAB — WET PREP FOR TRICH, YEAST, CLUE
Trichomonas Exam: NEGATIVE
Yeast Exam: NEGATIVE
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
## 2020-03-08 NOTE — Progress Notes (Signed)
Wet mount reviewed, pt denies symptoms, no tx per standing order. Provider orders completed. 

## 2020-03-13 LAB — GONOCOCCUS CULTURE

## 2020-06-11 ENCOUNTER — Encounter: Payer: Self-pay | Admitting: Emergency Medicine

## 2020-06-11 ENCOUNTER — Other Ambulatory Visit: Payer: Self-pay

## 2020-06-11 ENCOUNTER — Emergency Department
Admission: EM | Admit: 2020-06-11 | Discharge: 2020-06-11 | Disposition: A | Discharge status: Home / Self Care | Payer: PRIVATE HEALTH INSURANCE | Attending: Emergency Medicine | Admitting: Emergency Medicine

## 2020-06-11 DIAGNOSIS — F430 Acute stress reaction: Secondary | ICD-10-CM

## 2020-06-11 DIAGNOSIS — G44201 Tension-type headache, unspecified, intractable: Secondary | ICD-10-CM | POA: Insufficient documentation

## 2020-06-11 DIAGNOSIS — F439 Reaction to severe stress, unspecified: Secondary | ICD-10-CM | POA: Insufficient documentation

## 2020-06-11 DIAGNOSIS — F17210 Nicotine dependence, cigarettes, uncomplicated: Secondary | ICD-10-CM | POA: Insufficient documentation

## 2020-06-11 MED ORDER — METOCLOPRAMIDE HCL 10 MG PO TABS
10.0000 mg | ORAL_TABLET | Freq: Once | ORAL | Status: AC
  Administered 2020-06-11: 10 mg via ORAL
  Filled 2020-06-11: qty 1

## 2020-06-11 MED ORDER — METOCLOPRAMIDE HCL 5 MG PO TABS
5.0000 mg | ORAL_TABLET | Freq: Three times a day (TID) | ORAL | 0 refills | Status: DC | PRN
Start: 2020-06-11 — End: 2020-12-30

## 2020-06-11 MED ORDER — IBUPROFEN 800 MG PO TABS
800.0000 mg | ORAL_TABLET | Freq: Once | ORAL | Status: AC
  Administered 2020-06-11: 800 mg via ORAL
  Filled 2020-06-11: qty 1

## 2020-06-11 MED ORDER — IBUPROFEN 800 MG PO TABS: 800.0000 mg | ORAL_TABLET | Freq: Three times a day (TID) | ORAL | 0 refills | Status: DC | PRN

## 2020-06-11 MED ORDER — CYCLOBENZAPRINE HCL 5 MG PO TABS: 5.0000 mg | ORAL_TABLET | Freq: Three times a day (TID) | ORAL | 0 refills | Status: DC | PRN
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
## 2020-09-30 ENCOUNTER — Ambulatory Visit: Payer: Medicaid Other

## 2020-10-03 ENCOUNTER — Emergency Department
Admission: EM | Admit: 2020-10-03 | Discharge: 2020-10-03 | Disposition: A | Discharge status: Home / Self Care | Payer: PRIVATE HEALTH INSURANCE | Attending: Emergency Medicine | Admitting: Emergency Medicine

## 2020-10-03 ENCOUNTER — Other Ambulatory Visit: Payer: Self-pay

## 2020-10-03 DIAGNOSIS — F17210 Nicotine dependence, cigarettes, uncomplicated: Secondary | ICD-10-CM | POA: Insufficient documentation

## 2020-10-03 DIAGNOSIS — N762 Acute vulvitis: Secondary | ICD-10-CM | POA: Insufficient documentation

## 2020-10-03 LAB — CHLAMYDIA/NGC RT PCR (ARMC ONLY)
Chlamydia Tr: NOT DETECTED
N gonorrhoeae: NOT DETECTED

## 2020-10-03 LAB — POC URINE PREG, ED: Preg Test, Ur: NEGATIVE

## 2020-10-03 LAB — WET PREP, GENITAL
Sperm: NONE SEEN
Trich, Wet Prep: NONE SEEN
Yeast Wet Prep HPF POC: NONE SEEN

## 2020-10-03 MED ORDER — DOXYCYCLINE HYCLATE 100 MG PO CAPS: 100.0000 mg | ORAL_CAPSULE | Freq: Two times a day (BID) | ORAL | 0 refills | Status: AC

## 2020-10-03 MED ORDER — DOXYCYCLINE HYCLATE 100 MG PO CAPS
100.0000 mg | ORAL_CAPSULE | Freq: Two times a day (BID) | ORAL | 0 refills | Status: DC
Start: 2020-10-03 — End: 2020-10-03

## 2020-10-03 NOTE — ED Triage Notes (Signed)
Pt states that she started her period on Tuesday and noticed the R side of her labia was swollen- pt then noted that she was having pelvic pain on the R side- pt was supposed to get a pap and [facility] but was told she could not get a pap while on her period
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
## 2020-10-04 ENCOUNTER — Other Ambulatory Visit: Payer: Self-pay

## 2020-10-04 ENCOUNTER — Emergency Department
Admission: EM | Admit: 2020-10-04 | Discharge: 2020-10-04 | Disposition: A | Discharge status: Home / Self Care | Payer: Self-pay | Attending: Emergency Medicine | Admitting: Emergency Medicine

## 2020-10-04 ENCOUNTER — Encounter: Payer: Self-pay | Admitting: Emergency Medicine

## 2020-10-04 DIAGNOSIS — R21 Rash and other nonspecific skin eruption: Secondary | ICD-10-CM | POA: Insufficient documentation

## 2020-10-04 DIAGNOSIS — F17210 Nicotine dependence, cigarettes, uncomplicated: Secondary | ICD-10-CM | POA: Insufficient documentation

## 2020-10-04 MED ORDER — VALACYCLOVIR HCL 1 G PO TABS: 2000.0000 mg | ORAL_TABLET | Freq: Three times a day (TID) | ORAL | 0 refills | Status: AC

## 2020-10-04 MED ORDER — METRONIDAZOLE 500 MG PO TABS: 500.0000 mg | ORAL_TABLET | Freq: Two times a day (BID) | ORAL | 0 refills | Status: AC
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
## 2020-10-04 NOTE — Discharge Instructions (Addendum)
Take Valtrex 3 times daily for the next 7 days. Take Flagyl twice daily for the next 7 days.

## 2020-10-04 NOTE — ED Triage Notes (Signed)
Pt comes into the ED via POV c/o need for abscess to be drained.  Pt was seen yesterday but had to leave due to a funeral.  Pt back to complete the I&D of the right side of the labia. Pt in NAD.

## 2020-10-19 ENCOUNTER — Ambulatory Visit: Payer: Medicaid Other

## 2020-12-30 ENCOUNTER — Emergency Department
Admission: EM | Admit: 2020-12-30 | Discharge: 2020-12-30 | Disposition: A | Discharge status: Home / Self Care | Payer: Medicaid Other | Attending: Emergency Medicine | Admitting: Emergency Medicine

## 2020-12-30 ENCOUNTER — Other Ambulatory Visit: Payer: Self-pay

## 2020-12-30 ENCOUNTER — Encounter: Payer: Self-pay | Admitting: Emergency Medicine

## 2020-12-30 DIAGNOSIS — F17210 Nicotine dependence, cigarettes, uncomplicated: Secondary | ICD-10-CM | POA: Diagnosis not present

## 2020-12-30 DIAGNOSIS — N9489 Other specified conditions associated with female genital organs and menstrual cycle: Secondary | ICD-10-CM | POA: Insufficient documentation

## 2020-12-30 DIAGNOSIS — O219 Vomiting of pregnancy, unspecified: Secondary | ICD-10-CM | POA: Diagnosis present

## 2020-12-30 DIAGNOSIS — Z3A01 Less than 8 weeks gestation of pregnancy: Secondary | ICD-10-CM | POA: Diagnosis not present

## 2020-12-30 LAB — COMPREHENSIVE METABOLIC PANEL
ALT: 11 U/L (ref 0–44)
AST: 21 U/L (ref 15–41)
Albumin: 4.4 g/dL (ref 3.5–5.0)
Alkaline Phosphatase: 65 U/L (ref 38–126)
Anion gap: 7 (ref 5–15)
BUN: 6 mg/dL (ref 6–20)
CO2: 23 mmol/L (ref 22–32)
Calcium: 9.2 mg/dL (ref 8.9–10.3)
Chloride: 103 mmol/L (ref 98–111)
Creatinine, Ser: 0.54 mg/dL (ref 0.44–1.00)
GFR, Estimated: >60 mL/min (ref >60)
Glucose, Bld: 92 mg/dL (ref 70–99)
Potassium: 3.1 mmol/L — ABNORMAL LOW (ref 3.5–5.1)
Sodium: 133 mmol/L — ABNORMAL LOW (ref 135–145)
Total Bilirubin: 0.9 mg/dL (ref 0.3–1.2)
Total Protein: 8.1 g/dL (ref 6.5–8.1)

## 2020-12-30 LAB — URINALYSIS, ROUTINE W REFLEX MICROSCOPIC
Bilirubin Urine: NEGATIVE
Glucose, UA: NEGATIVE mg/dL
Hgb urine dipstick: NEGATIVE
Ketones, ur: 80 mg/dL — AB
Nitrite: NEGATIVE
Protein, ur: NEGATIVE mg/dL
Specific Gravity, Urine: 1.028 (ref 1.005–1.030)
pH: 5 (ref 5.0–8.0)

## 2020-12-30 LAB — LIPASE, BLOOD: Lipase: 17 U/L (ref 11–51)

## 2020-12-30 LAB — POC URINE PREG, ED: Preg Test, Ur: POSITIVE — AB

## 2020-12-30 LAB — CBC
HCT: 45 % (ref 36.0–46.0)
Hemoglobin: 14.8 g/dL (ref 12.0–15.0)
MCH: 28.8 pg (ref 26.0–34.0)
MCHC: 32.9 g/dL (ref 30.0–36.0)
MCV: 87.5 fL (ref 80.0–100.0)
Platelets: 224 10*3/uL (ref 150–400)
RBC: 5.14 MIL/uL — ABNORMAL HIGH (ref 3.87–5.11)
RDW: 13 % (ref 11.5–15.5)
WBC: 13.1 10*3/uL — ABNORMAL HIGH (ref 4.0–10.5)
nRBC: 0 % (ref 0.0–0.2)

## 2020-12-30 LAB — HCG, QUANTITATIVE, PREGNANCY: hCG, Beta Chain, Quant, S: 34749 m[IU]/mL — ABNORMAL HIGH (ref <5)

## 2020-12-30 MED ORDER — SODIUM CHLORIDE 0.9 % IV BOLUS
1000.0000 mL | Freq: Once | INTRAVENOUS | Status: AC
  Administered 2020-12-30: 17:00:00 1000 mL via INTRAVENOUS

## 2020-12-30 MED ORDER — METOCLOPRAMIDE HCL 10 MG PO TABS: 10.0000 mg | ORAL_TABLET | Freq: Four times a day (QID) | ORAL | 0 refills | Status: DC | PRN

## 2020-12-30 MED ORDER — METOCLOPRAMIDE HCL 5 MG/ML IJ SOLN
10.0000 mg | Freq: Once | INTRAMUSCULAR | Status: AC
  Administered 2020-12-30: 17:00:00 10 mg via INTRAVENOUS
  Filled 2020-12-30: qty 2

## 2020-12-30 NOTE — ED Notes (Signed)
Penpad failed for signing for discharge. Verbal consent received.

## 2020-12-30 NOTE — ED Notes (Signed)
When rechecking patient vitals and in room, patient began crying and reported IV was hurting. I asked where and when and she said when touching the needle. Let her know there was no needle in place and that the IV catheter was a soft plastic tube without a needle and should not hurt. Verified that the IV was patent and flushed NaCl slowly. Explained that this was the same solution as was in the NaCl IV fluid bag that was running.  No report of pain upon flushing, no resistance, and no signs of infiltration. Patient reported IV fluids had also burned previously. Offered to slow fluids down to improve patient comfort. Restarted bolus at slower rate and no discomfort reported.

## 2020-12-30 NOTE — ED Triage Notes (Signed)
Pt reports that she is having Emesis since Monday feels light headed at times.

## 2020-12-30 NOTE — ED Provider Notes (Signed)
Emergency Medicine Provider Triage Evaluation Note  Lauren Cunningham, a 25 y.o. female  was evaluated in triage.  Pt complains of evaluation of nausea and emesis with onset since Monday.  Patient report some associated lightheadedness patient denies any fever, chills, or sweats.  She denies any chest pain or shortness of breath.  Review of Systems  Positive: N/V Negative: FCS  Physical Exam  BP 108/79   Pulse 75   Temp 98.7 F (37.1 C) (Oral)   Resp 18   Ht 5' (1.524 m)   Wt 65.8 kg   SpO2 97%   BMI 28.32 kg/m  Gen:   Awake, no distress  NAD Resp:  Normal effort CTA MSK:   Moves extremities without difficulty  Other:  ABD: soft, nontender  Medical Decision Making  Medically screening exam initiated at 2:13 PM.  Appropriate orders placed.  Salvatore Marvel was informed that the remainder of the evaluation will be completed by another provider, this initial triage assessment does not replace that evaluation, and the importance of remaining in the ED until their evaluation is complete.  Patient ED evaluation of symptoms including nausea and vomiting since Monday.   Lissa Hoard, PA-C 12/30/20 1420    Sharyn Creamer, MD 12/30/20 1712

## 2020-12-30 NOTE — ED Notes (Signed)
Pt states took preg test on 11/14.
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
## 2020-12-30 NOTE — ED Notes (Signed)
This RN went into this pt's room upon seeing call light on. This pt instructed this RN to "remove this fucking needle from my arm now, it burns and it hurts." This RN explained to this pt that there was not a needle in the IV that it was a flexible catheter and assessed line for infiltration, no infiltration noted, blood return noted, line flushed easily. This RN instructed they would have to speak with MD before removing catheter and stopping fluids, this pt's visitor stated "it fucking hurts her, just take it out of her damn arm now". This RN left the room to talk to MD and saw call light was back on. This RN went into this pt's room to see pt had pulled catheter out of their arm. This RN controlled the bleeding and cleaned up fluids on floor from saline bag. MD made aware.

## 2020-12-30 NOTE — Discharge Instructions (Signed)
You have been seen in the emergency department today for nausea vomiting.  Your pregnancy test has resulted positive with a hormone level that correlates with an approximate 5-week pregnancy, which would give you an approximate due date of mid July.  Please follow-up with OB/GYN by calling the number provided to arrange a follow-up appointment for routine prenatal care.  Please drink plenty of fluids.

## 2020-12-30 NOTE — ED Notes (Addendum)
Pt presents for vomiting and lethargy- reports vomiting since Monday and cannot keep water, ginger ale or food down. Pt c/o headache since last night (pain scale 5/10). Has not been around anyone sick lately. No abdominal pain reported. No diarrhea reported.

## 2021-01-12 ENCOUNTER — Other Ambulatory Visit: Payer: Self-pay

## 2021-02-10 ENCOUNTER — Other Ambulatory Visit: Payer: Self-pay

## 2021-02-10 ENCOUNTER — Ambulatory Visit: Payer: Medicaid Other | Admitting: Physician Assistant

## 2021-02-10 ENCOUNTER — Encounter: Payer: Self-pay | Admitting: Physician Assistant

## 2021-02-10 VITALS — BP 105/68 | HR 91 | Temp 98.5°F | Ht 64.0 in | Wt 141.0 lb

## 2021-02-10 DIAGNOSIS — A5901 Trichomonal vulvovaginitis: Secondary | ICD-10-CM | POA: Insufficient documentation

## 2021-02-10 DIAGNOSIS — F17200 Nicotine dependence, unspecified, uncomplicated: Secondary | ICD-10-CM

## 2021-02-10 DIAGNOSIS — O0991 Supervision of high risk pregnancy, unspecified, first trimester: Secondary | ICD-10-CM | POA: Diagnosis not present

## 2021-02-10 DIAGNOSIS — O0990 Supervision of high risk pregnancy, unspecified, unspecified trimester: Secondary | ICD-10-CM | POA: Insufficient documentation

## 2021-02-10 DIAGNOSIS — F1290 Cannabis use, unspecified, uncomplicated: Secondary | ICD-10-CM

## 2021-02-10 LAB — WET PREP FOR TRICH, YEAST, CLUE
Trichomonas Exam: POSITIVE — AB
Yeast Exam: NEGATIVE

## 2021-02-10 LAB — URINALYSIS
Bilirubin, UA: NEGATIVE
Glucose, UA: NEGATIVE
Ketones, UA: NEGATIVE
Leukocytes,UA: NEGATIVE
Nitrite, UA: NEGATIVE
Protein,UA: NEGATIVE
RBC, UA: NEGATIVE
Specific Gravity, UA: 1.02 (ref 1.005–1.030)
Urobilinogen, Ur: 0.2 mg/dL (ref 0.2–1.0)
pH, UA: 6.5 (ref 5.0–7.5)

## 2021-02-10 LAB — OB RESULTS CONSOLE VARICELLA ZOSTER ANTIBODY, IGG: Varicella: IMMUNE

## 2021-02-10 LAB — HEMOGLOBIN, FINGERSTICK: Hemoglobin: 13.5 g/dL (ref 11.1–15.9)

## 2021-02-10 MED ORDER — PRENATAL 27-0.8 MG PO TABS: 1.0000 | ORAL_TABLET | Freq: Every day | ORAL | 2 refills | Status: AC

## 2021-02-10 MED ORDER — METRONIDAZOLE 500 MG PO TABS
500.0000 mg | ORAL_TABLET | Freq: Two times a day (BID) | ORAL | 0 refills | Status: AC
Start: 2021-02-10 — End: 2021-02-17

## 2021-02-10 NOTE — Progress Notes (Addendum)
Patient here for new OB at about 10 4/7. Here with her fiance's daughter. Lives with her brother and his partner and his 3 children. 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 ED visit 12/30/2020 for N/V. States she has an U/S scheduled at [facility] for 02/17/2021. Decline flu vaccine today.Marland KitchenMarland KitchenMarland KitchenBurt Knack, RN
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
## 2021-02-10 NOTE — Progress Notes (Addendum)
In house labs reviewed with provider. Patient treated for trich per SO. Partner card given. Patient counseled to call if [facility] doesn't have her PNV prescription filled by tomorrow afternoon. Patient states she will call [facility] to make sure about her U/S appointment and counseled we will call her with [facility] appointment when it's scheduled. Burt Knack, RN

## 2021-02-11 LAB — IGP, RFX APTIMA HPV ASCU: PAP Smear Comment: 0

## 2021-02-11 LAB — CBC/D/PLT+RPR+RH+ABO+AB SCR
Antibody Screen: NEGATIVE
Basophils Absolute: 0.1 10*3/uL (ref 0.0–0.2)
Basos: 0 %
EOS (ABSOLUTE): 0 10*3/uL (ref 0.0–0.4)
Eos: 0 %
Hematocrit: 41.3 % (ref 34.0–46.6)
Hemoglobin: 13.5 g/dL (ref 11.1–15.9)
Hepatitis B Surface Ag: NEGATIVE
Immature Grans (Abs): 0 10*3/uL (ref 0.0–0.1)
Immature Granulocytes: 0 %
Lymphocytes Absolute: 2.7 10*3/uL (ref 0.7–3.1)
Lymphs: 20 %
MCH: 28.1 pg (ref 26.6–33.0)
MCHC: 32.7 g/dL (ref 31.5–35.7)
MCV: 86 fL (ref 79–97)
Monocytes Absolute: 0.9 10*3/uL (ref 0.1–0.9)
Monocytes: 7 %
Neutrophils Absolute: 9.7 10*3/uL — ABNORMAL HIGH (ref 1.4–7.0)
Neutrophils: 73 %
Platelets: 232 10*3/uL (ref 150–450)
RBC: 4.81 x10E6/uL (ref 3.77–5.28)
RDW: 13.3 % (ref 11.7–15.4)
RPR Ser Ql: NONREACTIVE
Rh Factor: POSITIVE
WBC: 13.4 10*3/uL — ABNORMAL HIGH (ref 3.4–10.8)

## 2021-02-11 LAB — HCV AB W REFLEX TO QUANT PCR: HCV Ab: <0.1 s/co ratio (ref 0.0–0.9)

## 2021-02-11 LAB — HCV INTERPRETATION

## 2021-02-13 LAB — HSV CULTURE, NO TYPING: HSV Culture Without Typing: POSITIVE — AB

## 2021-02-13 LAB — CHLAMYDIA/GC NAA, CONFIRMATION
Chlamydia trachomatis, NAA: NEGATIVE
Neisseria gonorrhoeae, NAA: NEGATIVE

## 2021-02-14 LAB — URINE CULTURE

## 2021-02-15 ENCOUNTER — Encounter: Payer: Self-pay | Admitting: Advanced Practice Midwife

## 2021-02-15 DIAGNOSIS — O2640 Herpes gestationis, unspecified trimester: Secondary | ICD-10-CM | POA: Insufficient documentation

## 2021-02-15 LAB — HGB FRACTIONATION CASCADE
Hgb A2: 2.5 % (ref 1.8–3.2)
Hgb A: 97.5 % (ref 96.4–98.8)
Hgb F: 0 % (ref 0.0–2.0)
Hgb S: 0 %

## 2021-02-15 LAB — HIV-1/HIV-2 QUALITATIVE RNA
HIV-1 RNA, Qualitative: NONREACTIVE
HIV-2 RNA, Qualitative: NONREACTIVE

## 2021-02-15 LAB — VARICELLA ZOSTER ANTIBODY, IGG: Varicella zoster IgG: 1765 index (ref >165)

## 2021-02-16 DIAGNOSIS — O09892 Supervision of other high risk pregnancies, second trimester: Secondary | ICD-10-CM | POA: Insufficient documentation

## 2021-02-16 LAB — AVAILABLE
Amphetamines, Urine: NEGATIVE ng/mL
BENZODIAZ UR QL: NEGATIVE ng/mL
Barbiturate screen, urine: NEGATIVE ng/mL
Cocaine (Metab.): NEGATIVE ng/mL
OPIATE SCREEN URINE: NEGATIVE ng/mL
Oxycodone/Oxymorphone, Urine: NEGATIVE ng/mL
PCP Quant, Ur: NEGATIVE ng/mL

## 2021-02-16 LAB — CANNABINOID CONFIRMATION, UR
CANNABINOIDS: POSITIVE — AB
Carboxy THC GC/MS Conf: 293 ng/mL

## 2021-02-18 ENCOUNTER — Telehealth: Payer: Self-pay

## 2021-02-18 NOTE — Telephone Encounter (Signed)
TC to patient to schedule MH RV. Patient came for new OB appointment on 02/10/2021 and then had U/S and new OB appointment on 02/17/2021 at [facility]. LM asking patient to call back and schedule her next appointment or to let us know if she is planning to continue care at [facility]. Left number to call. Per Britta Mccreedy at [facility], she cannot schedule 1st trimester screen due to full schedule and she states we need to call [facility] location. TC to [facility] to find out if patient plans to continue care there. Per clerical at [facility] patient has had her new OB yesterday and has a return visit on 03/17/2021. Per provider notes from [facility] appt, they have discussed genetic testing with patient. Will save [facility] referral until patient calls back (on nurse to do cart).Burt Knack, RN

## 2021-04-11 ENCOUNTER — Emergency Department
Admission: EM | Admit: 2021-04-11 | Discharge: 2021-04-11 | Disposition: A | Discharge status: Home / Self Care | Payer: Medicaid Other | Attending: Emergency Medicine | Admitting: Emergency Medicine

## 2021-04-11 ENCOUNTER — Other Ambulatory Visit: Payer: Self-pay

## 2021-04-11 DIAGNOSIS — X58XXXA Exposure to other specified factors, initial encounter: Secondary | ICD-10-CM | POA: Diagnosis not present

## 2021-04-11 DIAGNOSIS — S63617A Unspecified sprain of left little finger, initial encounter: Secondary | ICD-10-CM | POA: Insufficient documentation

## 2021-04-11 DIAGNOSIS — S6992XA Unspecified injury of left wrist, hand and finger(s), initial encounter: Secondary | ICD-10-CM | POA: Diagnosis present

## 2021-04-11 NOTE — ED Provider Notes (Signed)
° °  [facility] Provider Note    Event Date/Time   First MD Initiated Contact with Patient 04/11/21 1455     (approximate)   History   Finger Injury   HPI  Lauren Cunningham is a 26 y.o. female who presents with complaints of left little finger pain.  Patient reports she developed pain overnight.  She thinks that maybe she rolled over on it in such a way that caused pain.  She denies redness, no swelling or bruising     Physical Exam   Triage Vital Signs: ED Triage Vitals  Enc Vitals Group     BP 04/11/21 1516 (!) 158/78     Pulse Rate 04/11/21 1516 78     Resp 04/11/21 1516 18     Temp 04/11/21 1516 98 F (36.7 C)     Temp Source 04/11/21 1516 Oral     SpO2 04/11/21 1516 98 %     Weight 04/11/21 1502 64 kg (141 lb 1.5 oz)     Height 04/11/21 1502 1.626 m (5\' 4" )     Head Circumference --      Peak Flow --      Pain Score 04/11/21 1427 5     Pain Loc --      Pain Edu? --      Excl. in GC? --     Most recent vital signs: Vitals:   04/11/21 1516  BP: (!) 158/78  Pulse: 78  Resp: 18  Temp: 98 F (36.7 C)  SpO2: 98%     General: Awake, no distress.  CV:  Good peripheral perfusion.  Resp:  Normal effort.  Abd:  No distention.  Other:  Left hand: Normal exam, no tenderness, no bruising, no redness or evidence of infection, no bony abnormalities   ED Results / Procedures / Treatments   Labs (all labs ordered are listed, but only abnormal results are displayed) Labs Reviewed - No data to display   EKG     RADIOLOGY     PROCEDURES:  Critical Care performed:   Procedures   MEDICATIONS ORDERED IN ED: Medications - No data to display   IMPRESSION / MDM / ASSESSMENT AND PLAN / ED COURSE  I reviewed the triage vital signs and the nursing notes.    Patient's exam is quite reassuring, suspect sprain, recommend ice, Tylenol as needed.  Patient has outpatient follow-up arranged if symptoms do not improve over the next 2 to  3 days.        FINAL CLINICAL IMPRESSION(S) / ED DIAGNOSES   Final diagnoses:  Sprain of left little finger, unspecified site of digit, initial encounter     Rx / DC Orders   ED Discharge Orders     None        Note:  This document was prepared using Dragon voice recognition software and may include unintentional dictation errors.   04/13/21, MD 04/11/21 04/13/21

## 2021-04-11 NOTE — ED Triage Notes (Signed)
Pt come with c/o left pinkie finger swelling. Pt states she woke up Saturday with it like this. Pt denies any known injuries.

## 2021-06-02 LAB — UNMAPPED LAB RESULTS
Basophil # (HT): 0.1 10 3/uL (ref 0.0–0.2)
Basophil % (HT): 1 % (ref 0–3)
Eosinophil # (HT): 0.2 10 3/uL (ref 0.0–0.6)
Eosinophil % (HT): 2 % (ref 0–5)
Hematocrit (HT): 40 % (ref 35–47)
Hemoglobin (HGB) (HT): 13 g/dL (ref 12.0–16.0)
Lymphocyte # (HT): 4.8 10 3/uL (ref 1.0–4.8)
Lymphocyte % (HT): 43 % (ref 15–45)
MCHC (HT): 32.8 g/dL (ref 31.0–37.5)
MCV (HT): 91 fL (ref 80–100)
Mean Corpuscular Hemoglobin (MCH) (HT): 29.9 pg (ref 26.0–34.0)
Monocyte # (HT): 0.6 10 3/uL (ref 0.1–1.0)
Monocyte % (HT): 6 % (ref 0–15)
Neutrophil # (HT): 5.5 10 3/uL (ref 1.8–8.0)
Platelets (HT): 336 10 3/uL (ref 150–450)
RBC (HT): 4.35 10 6/uL (ref 3.80–5.20)
RDW (HT): 12.4 % (ref 0.0–15.2)
Seg Neut % (HT): 49 % (ref 45–75)
WBC (HT): 11.2 10 3/uL — ABNORMAL HIGH (ref 4.0–11.0)

## 2021-06-14 ENCOUNTER — Ambulatory Visit: Payer: Medicaid Other
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
## 2021-07-25 ENCOUNTER — Ambulatory Visit: Payer: Medicaid Other

## 2021-08-01 LAB — OB RESULTS CONSOLE GBS: GBS: NEGATIVE

## 2021-08-12 ENCOUNTER — Encounter: Payer: Self-pay | Admitting: Obstetrics and Gynecology

## 2021-08-12 ENCOUNTER — Other Ambulatory Visit: Payer: Self-pay | Admitting: Obstetrics

## 2021-08-12 ENCOUNTER — Inpatient Hospital Stay
Admission: EM | Admit: 2021-08-12 | Discharge: 2021-08-16 | DRG: 787 | Disposition: A | Discharge status: Home / Self Care | Payer: Medicaid Other | Attending: Obstetrics and Gynecology | Admitting: Obstetrics and Gynecology

## 2021-08-12 ENCOUNTER — Encounter: Payer: Self-pay | Admitting: Obstetrics

## 2021-08-12 DIAGNOSIS — Z3A37 37 weeks gestation of pregnancy: Secondary | ICD-10-CM

## 2021-08-12 DIAGNOSIS — Z87891 Personal history of nicotine dependence: Secondary | ICD-10-CM

## 2021-08-12 DIAGNOSIS — O163 Unspecified maternal hypertension, third trimester: Secondary | ICD-10-CM | POA: Diagnosis present

## 2021-08-12 DIAGNOSIS — O9832 Other infections with a predominantly sexual mode of transmission complicating childbirth: Secondary | ICD-10-CM | POA: Diagnosis present

## 2021-08-12 DIAGNOSIS — F1290 Cannabis use, unspecified, uncomplicated: Secondary | ICD-10-CM | POA: Diagnosis present

## 2021-08-12 DIAGNOSIS — R030 Elevated blood-pressure reading, without diagnosis of hypertension: Secondary | ICD-10-CM | POA: Diagnosis present

## 2021-08-12 DIAGNOSIS — D62 Acute posthemorrhagic anemia: Secondary | ICD-10-CM | POA: Diagnosis not present

## 2021-08-12 DIAGNOSIS — O0990 Supervision of high risk pregnancy, unspecified, unspecified trimester: Principal | ICD-10-CM

## 2021-08-12 DIAGNOSIS — O9912 Other diseases of the blood and blood-forming organs and certain disorders involving the immune mechanism complicating childbirth: Secondary | ICD-10-CM | POA: Diagnosis present

## 2021-08-12 DIAGNOSIS — O99324 Drug use complicating childbirth: Secondary | ICD-10-CM | POA: Diagnosis present

## 2021-08-12 DIAGNOSIS — O1414 Severe pre-eclampsia complicating childbirth: Secondary | ICD-10-CM | POA: Diagnosis present

## 2021-08-12 DIAGNOSIS — O9081 Anemia of the puerperium: Secondary | ICD-10-CM | POA: Diagnosis not present

## 2021-08-12 DIAGNOSIS — D696 Thrombocytopenia, unspecified: Secondary | ICD-10-CM | POA: Diagnosis present

## 2021-08-12 DIAGNOSIS — A6000 Herpesviral infection of urogenital system, unspecified: Secondary | ICD-10-CM | POA: Diagnosis present

## 2021-08-12 DIAGNOSIS — O1413 Severe pre-eclampsia, third trimester: Secondary | ICD-10-CM | POA: Diagnosis present

## 2021-08-12 HISTORY — DX: Severe pre-eclampsia, third trimester: O14.13

## 2021-08-12 LAB — CBC WITH DIFFERENTIAL/PLATELET
Abs Immature Granulocytes: 0.12 10*3/uL — ABNORMAL HIGH (ref 0.00–0.07)
Basophils Absolute: 0 10*3/uL (ref 0.0–0.1)
Basophils Relative: 0 %
Eosinophils Absolute: 0.1 10*3/uL (ref 0.0–0.5)
Eosinophils Relative: 0 %
HCT: 38.5 % (ref 36.0–46.0)
Hemoglobin: 12.2 g/dL (ref 12.0–15.0)
Immature Granulocytes: 1 %
Lymphocytes Relative: 16 %
Lymphs Abs: 2.3 10*3/uL (ref 0.7–4.0)
MCH: 27.2 pg (ref 26.0–34.0)
MCHC: 31.7 g/dL (ref 30.0–36.0)
MCV: 85.7 fL (ref 80.0–100.0)
Monocytes Absolute: 1.2 10*3/uL — ABNORMAL HIGH (ref 0.1–1.0)
Monocytes Relative: 8 %
Neutro Abs: 10.8 10*3/uL — ABNORMAL HIGH (ref 1.7–7.7)
Neutrophils Relative %: 75 %
Platelets: 95 10*3/uL — ABNORMAL LOW (ref 150–400)
RBC: 4.49 MIL/uL (ref 3.87–5.11)
RDW: 15.9 % — ABNORMAL HIGH (ref 11.5–15.5)
WBC: 14.5 10*3/uL — ABNORMAL HIGH (ref 4.0–10.5)
nRBC: 0 % (ref 0.0–0.2)

## 2021-08-12 LAB — COMPREHENSIVE METABOLIC PANEL
ALT: 10 U/L (ref 0–44)
AST: 27 U/L (ref 15–41)
Albumin: 2.2 g/dL — ABNORMAL LOW (ref 3.5–5.0)
Alkaline Phosphatase: 223 U/L — ABNORMAL HIGH (ref 38–126)
Anion gap: 9 (ref 5–15)
BUN: 6 mg/dL (ref 6–20)
CO2: 21 mmol/L — ABNORMAL LOW (ref 22–32)
Calcium: 8.1 mg/dL — ABNORMAL LOW (ref 8.9–10.3)
Chloride: 109 mmol/L (ref 98–111)
Creatinine, Ser: 0.84 mg/dL (ref 0.44–1.00)
GFR, Estimated: >60 mL/min (ref >60)
Glucose, Bld: 75 mg/dL (ref 70–99)
Potassium: 3.2 mmol/L — ABNORMAL LOW (ref 3.5–5.1)
Sodium: 139 mmol/L (ref 135–145)
Total Bilirubin: 0.6 mg/dL (ref 0.3–1.2)
Total Protein: 5.8 g/dL — ABNORMAL LOW (ref 6.5–8.1)

## 2021-08-12 LAB — CBC
HCT: 38.3 % (ref 36.0–46.0)
Hemoglobin: 12.2 g/dL (ref 12.0–15.0)
MCH: 27.2 pg (ref 26.0–34.0)
MCHC: 31.9 g/dL (ref 30.0–36.0)
MCV: 85.3 fL (ref 80.0–100.0)
Platelets: 97 10*3/uL — ABNORMAL LOW (ref 150–400)
RBC: 4.49 MIL/uL (ref 3.87–5.11)
RDW: 16 % — ABNORMAL HIGH (ref 11.5–15.5)
WBC: 15.5 10*3/uL — ABNORMAL HIGH (ref 4.0–10.5)
nRBC: 0.1 % (ref 0.0–0.2)

## 2021-08-12 LAB — PROTEIN / CREATININE RATIO, URINE
Creatinine, Urine: 60 mg/dL
Protein Creatinine Ratio: 14.97 mg/mg{Cre} — ABNORMAL HIGH (ref 0.00–0.15)
Total Protein, Urine: 898 mg/dL

## 2021-08-12 LAB — TYPE AND SCREEN
ABO/RH(D): O POS
Antibody Screen: NEGATIVE

## 2021-08-12 MED ORDER — FENTANYL CITRATE (PF) 100 MCG/2ML IJ SOLN
INTRAMUSCULAR | Status: AC
  Filled 2021-08-12: qty 2

## 2021-08-12 MED ORDER — LACTATED RINGERS IV SOLN
INTRAVENOUS | Status: DC
  Administered 2021-08-13: 250 mL via INTRAVENOUS

## 2021-08-12 MED ORDER — MISOPROSTOL 25 MCG QUARTER TABLET
ORAL_TABLET | ORAL | Status: AC
  Administered 2021-08-12: 25 ug via VAGINAL
  Filled 2021-08-12: qty 2

## 2021-08-12 MED ORDER — LIDOCAINE HCL (PF) 1 % IJ SOLN: 30.0000 mL | INTRAMUSCULAR | Status: DC | PRN

## 2021-08-12 MED ORDER — MISOPROSTOL 25 MCG QUARTER TABLET
25.0000 ug | ORAL_TABLET | ORAL | Status: DC | PRN
  Filled 2021-08-12: qty 1

## 2021-08-12 MED ORDER — LABETALOL HCL 5 MG/ML IV SOLN
80.0000 mg | INTRAVENOUS | Status: DC | PRN
  Administered 2021-08-12: 80 mg via INTRAVENOUS
  Filled 2021-08-12: qty 16

## 2021-08-12 MED ORDER — ACETAMINOPHEN 325 MG PO TABS: 650.0000 mg | ORAL_TABLET | ORAL | Status: DC | PRN

## 2021-08-12 MED ORDER — AMMONIA AROMATIC IN INHA
RESPIRATORY_TRACT | Status: AC
  Filled 2021-08-12: qty 10

## 2021-08-12 MED ORDER — HYDRALAZINE HCL 20 MG/ML IJ SOLN: 10.0000 mg | INTRAMUSCULAR | Status: DC | PRN

## 2021-08-12 MED ORDER — OXYTOCIN BOLUS FROM INFUSION: 333.0000 mL | Freq: Once | INTRAVENOUS | Status: DC

## 2021-08-12 MED ORDER — OXYTOCIN-SODIUM CHLORIDE 30-0.9 UT/500ML-% IV SOLN: 1.0000 m[IU]/min | INTRAVENOUS | Status: DC

## 2021-08-12 MED ORDER — LABETALOL HCL 5 MG/ML IV SOLN
40.0000 mg | INTRAVENOUS | Status: DC | PRN
  Administered 2021-08-13: 40 mg via INTRAVENOUS
  Filled 2021-08-12 (×2): qty 8

## 2021-08-12 MED ORDER — MISOPROSTOL 25 MCG QUARTER TABLET
25.0000 ug | ORAL_TABLET | Freq: Once | ORAL | Status: AC
Start: 2021-08-12 — End: 2021-08-12

## 2021-08-12 MED ORDER — FENTANYL CITRATE (PF) 100 MCG/2ML IJ SOLN: 50.0000 ug | INTRAMUSCULAR | Status: DC | PRN

## 2021-08-12 MED ORDER — LABETALOL HCL 5 MG/ML IV SOLN
20.0000 mg | INTRAVENOUS | Status: DC | PRN
  Administered 2021-08-12: 20 mg via INTRAVENOUS
  Filled 2021-08-12: qty 4

## 2021-08-12 MED ORDER — MISOPROSTOL 200 MCG PO TABS
ORAL_TABLET | ORAL | Status: AC
  Filled 2021-08-12: qty 4

## 2021-08-12 MED ORDER — LACTATED RINGERS IV SOLN
INTRAVENOUS | Status: DC
  Administered 2021-08-12: 75 mL via INTRAVENOUS

## 2021-08-12 MED ORDER — MAGNESIUM SULFATE 40 GM/1000ML IV SOLN
2.0000 g/h | INTRAVENOUS | Status: DC
  Filled 2021-08-12: qty 1000

## 2021-08-12 MED ORDER — LACTATED RINGERS IV SOLN: 500.0000 mL | INTRAVENOUS | Status: DC | PRN

## 2021-08-12 MED ORDER — LABETALOL HCL 5 MG/ML IV SOLN
80.0000 mg | INTRAVENOUS | Status: DC | PRN
  Administered 2021-08-13: 80 mg via INTRAVENOUS
  Filled 2021-08-12 (×2): qty 16

## 2021-08-12 MED ORDER — MISOPROSTOL 25 MCG QUARTER TABLET
25.0000 ug | ORAL_TABLET | Freq: Once | ORAL | Status: AC
  Administered 2021-08-12: 25 ug via ORAL

## 2021-08-12 MED ORDER — LABETALOL HCL 5 MG/ML IV SOLN
40.0000 mg | INTRAVENOUS | Status: DC | PRN
  Administered 2021-08-12: 40 mg via INTRAVENOUS

## 2021-08-12 MED ORDER — TERBUTALINE SULFATE 1 MG/ML IJ SOLN: 0.2500 mg | Freq: Once | INTRAMUSCULAR | Status: DC | PRN

## 2021-08-12 MED ORDER — HYDRALAZINE HCL 20 MG/ML IJ SOLN
INTRAMUSCULAR | Status: AC
  Filled 2021-08-12: qty 1

## 2021-08-12 MED ORDER — CALCIUM GLUCONATE 10 % IV SOLN
INTRAVENOUS | Status: AC
  Filled 2021-08-12: qty 10

## 2021-08-12 MED ORDER — OXYTOCIN 10 UNIT/ML IJ SOLN
INTRAMUSCULAR | Status: AC
  Filled 2021-08-12: qty 2

## 2021-08-12 MED ORDER — MAGNESIUM SULFATE 40 GM/1000ML IV SOLN
INTRAVENOUS | Status: AC
  Administered 2021-08-12: 2 g/h via INTRAVENOUS
  Filled 2021-08-12: qty 1000

## 2021-08-12 MED ORDER — ONDANSETRON HCL 4 MG/2ML IJ SOLN: 4.0000 mg | Freq: Four times a day (QID) | INTRAMUSCULAR | Status: DC | PRN

## 2021-08-12 MED ORDER — LABETALOL HCL 5 MG/ML IV SOLN
20.0000 mg | INTRAVENOUS | Status: DC | PRN
  Administered 2021-08-13: 20 mg via INTRAVENOUS
  Filled 2021-08-12 (×2): qty 4

## 2021-08-12 MED ORDER — SOD CITRATE-CITRIC ACID 500-334 MG/5ML PO SOLN
30.0000 mL | ORAL | Status: DC | PRN
  Administered 2021-08-13: 30 mL via ORAL

## 2021-08-12 MED ORDER — OXYTOCIN-SODIUM CHLORIDE 30-0.9 UT/500ML-% IV SOLN
2.5000 [IU]/h | INTRAVENOUS | Status: DC
  Administered 2021-08-13: 2.5 [IU]/h via INTRAVENOUS

## 2021-08-12 MED ORDER — LABETALOL HCL 5 MG/ML IV SOLN
40.0000 mg | Freq: Once | INTRAVENOUS | Status: AC
  Administered 2021-08-12: 40 mg via INTRAVENOUS

## 2021-08-12 MED ORDER — ACETAMINOPHEN 500 MG PO TABS
1000.0000 mg | ORAL_TABLET | Freq: Four times a day (QID) | ORAL | Status: DC | PRN
  Administered 2021-08-12: 1000 mg via ORAL
  Filled 2021-08-12: qty 2

## 2021-08-12 MED ORDER — MAGNESIUM SULFATE BOLUS VIA INFUSION
4.0000 g | Freq: Once | INTRAVENOUS | Status: AC
  Administered 2021-08-12: 4 g via INTRAVENOUS
  Filled 2021-08-12: qty 1000

## 2021-08-12 MED ORDER — HYDRALAZINE HCL 20 MG/ML IJ SOLN
10.0000 mg | INTRAMUSCULAR | Status: DC | PRN
  Administered 2021-08-12 – 2021-08-13 (×2): 10 mg via INTRAVENOUS
  Filled 2021-08-12 (×2): qty 1

## 2021-08-12 MED ORDER — OXYTOCIN-SODIUM CHLORIDE 30-0.9 UT/500ML-% IV SOLN
INTRAVENOUS | Status: AC
  Filled 2021-08-12: qty 500

## 2021-08-12 MED ORDER — HYDRALAZINE HCL 20 MG/ML IJ SOLN
10.0000 mg | Freq: Once | INTRAMUSCULAR | Status: AC
Start: 2021-08-12 — End: 2021-08-12
  Administered 2021-08-12: 10 mg via INTRAVENOUS

## 2021-08-12 MED ORDER — LIDOCAINE HCL (PF) 1 % IJ SOLN
INTRAMUSCULAR | Status: AC
  Filled 2021-08-12: qty 30

## 2021-08-12 MED ORDER — LABETALOL HCL 5 MG/ML IV SOLN
80.0000 mg | Freq: Once | INTRAVENOUS | Status: AC | PRN
  Administered 2021-08-12: 80 mg via INTRAVENOUS

## 2021-08-12 NOTE — Progress Notes (Signed)
Labor Progress Note  Lauren Cunningham is a 26 y.o. G1P0000 at [redacted]w[redacted]d by LMP admitted for induction of labor due to Pre-eclamptic toxemia of pregnancy..  Subjective: resting in bed. She reports SOB on exertion and while lying in certain positions in the bed. She states that this along with the blurry vision and headaches began about 2 weeks ago. She denies feeling in discomfort with contractions at this time.  Objective: BP (!) 165/108   Pulse 97   Temp 98.3 F (36.8 C) (Oral)   Resp 16   Ht 5\' 3"  (1.6 m)   Wt 100.7 kg   LMP 11/24/2020 (Exact Date)   SpO2 97%   BMI 39.33 kg/m  Notable VS details:   Fetal Assessment: FHT:  FHR: 135 bpm, variability: moderate,  accelerations:  Present,  decelerations:  Absent Category/reactivity:  Category I UC:   regular, every 2-3 minutes SVE:    Membrane status: intact Amniotic color: none  Labs: Lab Results  Component Value Date   WBC 14.5 (H) 08/12/2021   HGB 12.2 08/12/2021   HCT 38.5 08/12/2021   MCV 85.7 08/12/2021   PLT 95 (L) 08/12/2021    Assessment / Plan: Pre-eclampsia with severe features -admin additional 40mg  of labetolol -continue Magnesium 2gm/hr -monitor for increased or worsening s/s -Dr 08/14/2021 notified and aware of recent BP Labor: Progressing normally Preeclampsia:  on magnesium sulfate and intake and ouput balanced Fetal Wellbeing:  Category I Pain Control:  Labor support without medications I/D:  n/a Anticipated MOD:  NSVD  Lauren Cunningham Lauren Cunningham, CNM 08/12/2021, 10:23 PM

## 2021-08-12 NOTE — Progress Notes (Signed)
Labor Progress Note  Lauren Cunningham is a 26 y.o. G1P0000 at [redacted]w[redacted]d by LMP admitted for induction of labor due to Pre-eclamptic toxemia of pregnancy..  Subjective: resting in bed, denies feeling discomfort with contractions.  Objective: BP (!) 165/108   Pulse 97   Temp 98.3 F (36.8 C) (Oral)   Resp 16   Ht 5\' 3"  (1.6 m)   Wt 100.7 kg   LMP 11/24/2020 (Exact Date)   SpO2 97%   BMI 39.33 kg/m  Notable VS details:   Fetal Assessment: FHT:  FHR: 135 bpm, variability: moderate,  accelerations:  Present,  decelerations:  Absent Category/reactivity:  Category I UC:   regular, every 2-3 minutes SVE:    Membrane status: intact Amniotic color: N/A  Labs: Lab Results  Component Value Date   WBC 14.5 (H) 08/12/2021   HGB 12.2 08/12/2021   HCT 38.5 08/12/2021   MCV 85.7 08/12/2021   PLT 95 (L) 08/12/2021    Assessment / Plan: -IOL for preeclampsia  -Discussed delivery via C-Section with patient due to worsening s/s of preeclampsia. Patient is amendable -BP remains severe range after additional  -Stat CBC ordered placed for 2330 -magnesium 2gm/hr  Dr 2331 aware  Labor:  will reevaluate for cervical change and decrease in BP  Preeclampsia:  on magnesium sulfate and intake and ouput balanced Fetal Wellbeing:  Category I Pain Control:  Labor support without medications I/D:  n/a Anticipated MOD:   C-Section  Kerrie Latour LUCY LORENA Jasara Corrigan, CNM 08/12/2021, 10:43 PM

## 2021-08-12 NOTE — H&P (Signed)
Lauren Cunningham is a 26 y.o. female presenting for elevated BP , h/a and vision changes for the last week .  BP today in severe range  + h/o genital HSV and pt denies recent outbreak . Currently on valtrex. OB History     Gravida  1   Para  0   Term  0   Preterm  0   AB  0   Living  0      SAB  0   IAB  0   Ectopic  0   Multiple  0   Live Births  0        Obstetric Comments  1st Menstrual Cycle: 10          Past Medical History:  Diagnosis Date   Pilonidal cyst 2019   Past Surgical History:  Procedure Laterality Date   pilonidal cyst surgery  2011   Dr Michela Pitcher   WISDOM TOOTH EXTRACTION Left    Family History: family history is not on file. Social History:  reports that she quit smoking about 7 months ago. Her smoking use included cigarettes. She has a 2.50 pack-year smoking history. She has been exposed to tobacco smoke. She has never used smokeless tobacco. She reports that she does not currently use alcohol after a past usage of about 2.0 standard drinks of alcohol per week. She reports current drug use. Drug: Marijuana.     Maternal Diabetes: No Genetic Screening: Normal Maternal Ultrasounds/Referrals: Normal Fetal Ultrasounds or other Referrals:  None Maternal Substance Abuse:  No Significant Maternal Medications:  None Significant Maternal Lab Results:  Group B Strep negative Other Comments:  None  Review of Systems History Dilation: Closed Effacement (%): 50, 60 Exam by:: Betzalel Umbarger Blood pressure (!) 165/114, pulse 91, temperature 98.1 F (36.7 C), temperature source Oral, height 5\' 3"  (1.6 m), weight 100.7 kg, last menstrual period 11/24/2020. Exam Physical Exam  Lungs cta   Cv  RRR without Murmur  Abd ; gravid , non tender  Pelvic no HSV lesions visualized externally  Prenatal labs: ABO, Rh: --/--/O POS (06/30 1631) Antibody: NEG (06/30 1631) Rubella:  IMM , Vz IMM RPR: Non Reactive (12/29 1540)  HBsAg: Negative (12/29 1540)   HIV:   Neg  GBS:   neg   Assessment/Plan: Preeclampsia with severe features including BP , persistent h/a, vision changes and now thrombocytopenia  ( 95K) Labetalol and hydralazine protocol for keeping BP < 160/110 Start Magnesium for Sz prophylaxis  Cytotec oral 25 mcg and 25 mcg vaginal started  Pt is unsure about epidural and I suggested that if she is interested to ask Anesthesiology to place now before plt may drop more . She is undecided at this time .  I have explained the pathophysiology of preeclampsia and the indication for delivery . I spoke to her about the sequela of PIH and the risks of sz activity . She is aware and all questions have been answered .   10[birth year]-2004 Lauren Cunningham 08/12/2021, 6:19 PM

## 2021-08-12 NOTE — OB Triage Note (Signed)
Pt is a G1P0 at 36.5 week with h/o HA and changes in vision for the past week.  Today in the office her BP was elevated so sent for labs and PRE-E evaluation.

## 2021-08-12 NOTE — Anesthesia Preprocedure Evaluation (Addendum)
Anesthesia Evaluation  Patient identified by MRN, date of birth, ID band Patient awake    Reviewed: Allergy & Precautions, H&P , NPO status , Patient's Chart, lab work & pertinent test results  Airway Mallampati: II  TM Distance: >3 FB Neck ROM: Full    Dental no notable dental hx.    Pulmonary neg pulmonary ROS, former smoker,    Pulmonary exam normal breath sounds clear to auscultation       Cardiovascular Exercise Tolerance: Good hypertension, Normal cardiovascular exam Rhythm:regular Rate:Normal     Neuro/Psych    GI/Hepatic negative GI ROS,   Endo/Other    Renal/GU   negative genitourinary   Musculoskeletal   Abdominal   Peds  Hematology Hgb 12.2 g/dL Plts 74F at 4239     Anesthesia Other Findings Past Medical History: 2019: Pilonidal cyst 08/12/2021: Preeclampsia, severe, third trimester  Past Surgical History: 2011: pilonidal cyst surgery     Comment:  Dr Michela Pitcher No date: WISDOM TOOTH EXTRACTION; Left  BMI    Body Mass Index: 39.33 kg/m      Reproductive/Obstetrics (+) Pregnancy 26 y.o. G1P0000 at [redacted]w[redacted]d admitted for induction of labor due to preeclampsia with severe features including severe range BP, persistent h/a, vision changes and now thrombocytopenia  Added on for C/S due to worsening H/A and BP                            Anesthesia Physical Anesthesia Plan  ASA: 3  Anesthesia Plan: Spinal   Post-op Pain Management:    Induction:   PONV Risk Score and Plan:   Airway Management Planned:   Additional Equipment:   Intra-op Plan:   Post-operative Plan:   Informed Consent: I have reviewed the patients History and Physical, chart, labs and discussed the procedure including the risks, benefits and alternatives for the proposed anesthesia with the patient or authorized representative who has indicated his/her understanding and acceptance.     Dental Advisory  Given  Plan Discussed with: Anesthesiologist  Anesthesia Plan Comments:         Anesthesia Quick Evaluation  Re-check platelets and coags stat Repeat platelets 97K, coags reassuring.  Pt denies any easy bleeding/bruising or known bleeding problems. Will proceed with spinal anesthesia in accordance with the SOAP consensus statement on neuraxial in thrombocytopenic obstetric patients.  Discussed R/B of spinal vs general anesthesia for delivery.  Questions invited and answered.  Pt and family are in agreement with the plan.  K Laurel Harnden

## 2021-08-13 ENCOUNTER — Other Ambulatory Visit: Payer: Self-pay

## 2021-08-13 ENCOUNTER — Encounter
Admission: EM | Disposition: A | Discharge status: Home / Self Care | Payer: Self-pay | Source: Home / Self Care | Attending: Obstetrics and Gynecology

## 2021-08-13 ENCOUNTER — Inpatient Hospital Stay: Payer: Medicaid Other | Admitting: Anesthesiology

## 2021-08-13 ENCOUNTER — Encounter: Payer: Self-pay | Admitting: Obstetrics and Gynecology

## 2021-08-13 LAB — COMPREHENSIVE METABOLIC PANEL
ALT: 11 U/L (ref 0–44)
AST: 30 U/L (ref 15–41)
Albumin: 1.7 g/dL — ABNORMAL LOW (ref 3.5–5.0)
Alkaline Phosphatase: 182 U/L — ABNORMAL HIGH (ref 38–126)
Anion gap: 8 (ref 5–15)
BUN: 6 mg/dL (ref 6–20)
CO2: 19 mmol/L — ABNORMAL LOW (ref 22–32)
Calcium: 7.1 mg/dL — ABNORMAL LOW (ref 8.9–10.3)
Chloride: 106 mmol/L (ref 98–111)
Creatinine, Ser: 0.66 mg/dL (ref 0.44–1.00)
GFR, Estimated: >60 mL/min (ref >60)
Glucose, Bld: 176 mg/dL — ABNORMAL HIGH (ref 70–99)
Potassium: 3.4 mmol/L — ABNORMAL LOW (ref 3.5–5.1)
Sodium: 133 mmol/L — ABNORMAL LOW (ref 135–145)
Total Bilirubin: 0.4 mg/dL (ref 0.3–1.2)
Total Protein: 4.9 g/dL — ABNORMAL LOW (ref 6.5–8.1)

## 2021-08-13 LAB — CBC
HCT: 34.2 % — ABNORMAL LOW (ref 36.0–46.0)
Hemoglobin: 10.8 g/dL — ABNORMAL LOW (ref 12.0–15.0)
MCH: 27.3 pg (ref 26.0–34.0)
MCHC: 31.6 g/dL (ref 30.0–36.0)
MCV: 86.4 fL (ref 80.0–100.0)
Platelets: 90 10*3/uL — ABNORMAL LOW (ref 150–400)
RBC: 3.96 MIL/uL (ref 3.87–5.11)
RDW: 16 % — ABNORMAL HIGH (ref 11.5–15.5)
WBC: 17.5 10*3/uL — ABNORMAL HIGH (ref 4.0–10.5)
nRBC: 0 % (ref 0.0–0.2)

## 2021-08-13 LAB — FIBRINOGEN: Fibrinogen: 604 mg/dL — ABNORMAL HIGH (ref 210–475)

## 2021-08-13 LAB — MAGNESIUM: Magnesium: 5.8 mg/dL — ABNORMAL HIGH (ref 1.7–2.4)

## 2021-08-13 LAB — APTT: aPTT: 29 seconds (ref 24–36)

## 2021-08-13 LAB — PROTIME-INR
INR: 0.9 (ref 0.8–1.2)
Prothrombin Time: 12.3 seconds (ref 11.4–15.2)

## 2021-08-13 SURGERY — Surgical Case
Anesthesia: Spinal

## 2021-08-13 MED ORDER — NALOXONE HCL 4 MG/10ML IJ SOLN: 1.0000 ug/kg/h | INTRAVENOUS | Status: DC | PRN

## 2021-08-13 MED ORDER — SCOPOLAMINE 1 MG/3DAYS TD PT72
1.0000 | MEDICATED_PATCH | Freq: Once | TRANSDERMAL | Status: AC
  Administered 2021-08-13: 1.5 mg via TRANSDERMAL
  Filled 2021-08-13: qty 1

## 2021-08-13 MED ORDER — GABAPENTIN 300 MG PO CAPS
400.0000 mg | ORAL_CAPSULE | Freq: Every day | ORAL | Status: DC
  Administered 2021-08-14 – 2021-08-15 (×3): 400 mg via ORAL
  Filled 2021-08-13 (×4): qty 1

## 2021-08-13 MED ORDER — LABETALOL HCL 200 MG PO TABS
400.0000 mg | ORAL_TABLET | Freq: Three times a day (TID) | ORAL | Status: DC
  Administered 2021-08-13 – 2021-08-14 (×5): 400 mg via ORAL
  Filled 2021-08-13 (×4): qty 4
  Filled 2021-08-13 (×2): qty 2

## 2021-08-13 MED ORDER — PRENATAL MULTIVITAMIN CH
1.0000 | ORAL_TABLET | Freq: Every day | ORAL | Status: DC
Start: 2021-08-13 — End: 2021-08-16
  Administered 2021-08-13 – 2021-08-16 (×4): 1 via ORAL
  Filled 2021-08-13 (×4): qty 1

## 2021-08-13 MED ORDER — TRANEXAMIC ACID-NACL 1000-0.7 MG/100ML-% IV SOLN
INTRAVENOUS | Status: DC
Start: 2021-08-13 — End: 2021-08-13
  Filled 2021-08-13: qty 100

## 2021-08-13 MED ORDER — BUPIVACAINE HCL (PF) 0.5 % IJ SOLN
INTRAMUSCULAR | Status: AC
  Filled 2021-08-13: qty 60

## 2021-08-13 MED ORDER — MENTHOL 3 MG MT LOZG: 1.0000 | LOZENGE | OROMUCOSAL | Status: DC | PRN

## 2021-08-13 MED ORDER — METHYLERGONOVINE MALEATE 0.2 MG/ML IJ SOLN
INTRAMUSCULAR | Status: AC
  Filled 2021-08-13: qty 1

## 2021-08-13 MED ORDER — ACETAMINOPHEN 500 MG PO TABS: 1000.0000 mg | ORAL_TABLET | Freq: Four times a day (QID) | ORAL | Status: DC

## 2021-08-13 MED ORDER — CEFAZOLIN SODIUM-DEXTROSE 2-4 GM/100ML-% IV SOLN
INTRAVENOUS | Status: AC
  Filled 2021-08-13: qty 100

## 2021-08-13 MED ORDER — HYDROCHLOROTHIAZIDE 25 MG PO TABS
25.0000 mg | ORAL_TABLET | Freq: Every day | ORAL | Status: DC
Start: 2021-08-13 — End: 2021-08-13
  Filled 2021-08-13: qty 1

## 2021-08-13 MED ORDER — OXYCODONE HCL 5 MG PO TABS
5.0000 mg | ORAL_TABLET | ORAL | Status: DC | PRN
  Administered 2021-08-13: 10 mg via ORAL
  Administered 2021-08-13 – 2021-08-14 (×3): 5 mg via ORAL
  Administered 2021-08-14 (×2): 10 mg via ORAL
  Administered 2021-08-15 (×2): 5 mg via ORAL
  Administered 2021-08-15: 10 mg via ORAL
  Administered 2021-08-16 (×3): 5 mg via ORAL
  Filled 2021-08-13: qty 2
  Filled 2021-08-13 (×2): qty 1
  Filled 2021-08-13: qty 2
  Filled 2021-08-13: qty 1
  Filled 2021-08-13: qty 2
  Filled 2021-08-13 (×2): qty 1
  Filled 2021-08-13 (×2): qty 2
  Filled 2021-08-13 (×2): qty 1

## 2021-08-13 MED ORDER — DIPHENHYDRAMINE HCL 25 MG PO CAPS: 25.0000 mg | ORAL_CAPSULE | ORAL | Status: DC | PRN

## 2021-08-13 MED ORDER — MORPHINE SULFATE (PF) 0.5 MG/ML IJ SOLN
INTRAMUSCULAR | Status: AC
  Filled 2021-08-13: qty 10

## 2021-08-13 MED ORDER — MORPHINE SULFATE (PF) 0.5 MG/ML IJ SOLN
INTRAMUSCULAR | Status: DC | PRN
  Administered 2021-08-13: .15 mg via INTRATHECAL

## 2021-08-13 MED ORDER — LACTATED RINGERS IV BOLUS: 250.0000 mL | INTRAVENOUS | Status: DC

## 2021-08-13 MED ORDER — DIPHENHYDRAMINE HCL 25 MG PO CAPS: 25.0000 mg | ORAL_CAPSULE | Freq: Four times a day (QID) | ORAL | Status: DC | PRN

## 2021-08-13 MED ORDER — OXYTOCIN-SODIUM CHLORIDE 30-0.9 UT/500ML-% IV SOLN
INTRAVENOUS | Status: DC | PRN
  Administered 2021-08-13: 300 mL via INTRAVENOUS

## 2021-08-13 MED ORDER — KETOROLAC TROMETHAMINE 30 MG/ML IJ SOLN: 30.0000 mg | Freq: Four times a day (QID) | INTRAMUSCULAR | Status: AC | PRN

## 2021-08-13 MED ORDER — CARBOPROST TROMETHAMINE 250 MCG/ML IM SOLN
INTRAMUSCULAR | Status: AC
  Filled 2021-08-13: qty 1

## 2021-08-13 MED ORDER — BUPIVACAINE IN DEXTROSE 0.75-8.25 % IT SOLN
INTRATHECAL | Status: DC | PRN
  Administered 2021-08-13: 1.5 mL via INTRATHECAL

## 2021-08-13 MED ORDER — ONDANSETRON HCL 4 MG/2ML IJ SOLN: 4.0000 mg | Freq: Three times a day (TID) | INTRAMUSCULAR | Status: DC | PRN

## 2021-08-13 MED ORDER — MORPHINE SULFATE (PF) 2 MG/ML IV SOLN: 1.0000 mg | INTRAVENOUS | Status: DC | PRN

## 2021-08-13 MED ORDER — SODIUM CHLORIDE 0.9% FLUSH: 3.0000 mL | INTRAVENOUS | Status: DC | PRN

## 2021-08-13 MED ORDER — SOD CITRATE-CITRIC ACID 500-334 MG/5ML PO SOLN
ORAL | Status: AC
  Filled 2021-08-13: qty 15

## 2021-08-13 MED ORDER — SENNOSIDES-DOCUSATE SODIUM 8.6-50 MG PO TABS
2.0000 | ORAL_TABLET | Freq: Every day | ORAL | Status: DC
  Administered 2021-08-14 – 2021-08-15 (×2): 2 via ORAL
  Filled 2021-08-13 (×3): qty 2

## 2021-08-13 MED ORDER — OXYTOCIN-SODIUM CHLORIDE 30-0.9 UT/500ML-% IV SOLN
INTRAVENOUS | Status: AC
  Filled 2021-08-13: qty 500

## 2021-08-13 MED ORDER — LACTATED RINGERS IV BOLUS
250.0000 mL | Freq: Once | INTRAVENOUS | Status: AC
Start: 2021-08-13 — End: 2021-08-13
  Administered 2021-08-13: 250 mL via INTRAVENOUS

## 2021-08-13 MED ORDER — SIMETHICONE 80 MG PO CHEW
80.0000 mg | CHEWABLE_TABLET | ORAL | Status: DC | PRN
  Administered 2021-08-16: 80 mg via ORAL
  Filled 2021-08-13: qty 1

## 2021-08-13 MED ORDER — MIDAZOLAM HCL 2 MG/2ML IJ SOLN
INTRAMUSCULAR | Status: DC | PRN
  Administered 2021-08-13: 2 mg via INTRAVENOUS

## 2021-08-13 MED ORDER — SIMETHICONE 80 MG PO CHEW
80.0000 mg | CHEWABLE_TABLET | Freq: Three times a day (TID) | ORAL | Status: DC
  Administered 2021-08-13 – 2021-08-16 (×6): 80 mg via ORAL
  Filled 2021-08-13 (×6): qty 1

## 2021-08-13 MED ORDER — MIDAZOLAM HCL 2 MG/2ML IJ SOLN
INTRAMUSCULAR | Status: AC
  Filled 2021-08-13: qty 2

## 2021-08-13 MED ORDER — SODIUM CHLORIDE 0.9 % IV SOLN
INTRAVENOUS | Status: DC | PRN
  Administered 2021-08-13: 500 mg via INTRAVENOUS

## 2021-08-13 MED ORDER — FLUMAZENIL 0.5 MG/5ML IV SOLN
INTRAVENOUS | Status: DC | PRN
  Administered 2021-08-13 (×2): .1 mg via INTRAVENOUS

## 2021-08-13 MED ORDER — TETANUS-DIPHTH-ACELL PERTUSSIS 5-2.5-18.5 LF-MCG/0.5 IM SUSY
0.5000 mL | PREFILLED_SYRINGE | Freq: Once | INTRAMUSCULAR | Status: DC
  Filled 2021-08-13: qty 0.5

## 2021-08-13 MED ORDER — COCONUT OIL OIL: 1.0000 | TOPICAL_OIL | Status: DC | PRN

## 2021-08-13 MED ORDER — LABETALOL HCL 5 MG/ML IV SOLN
40.0000 mg | INTRAVENOUS | Status: DC | PRN
  Filled 2021-08-13: qty 8

## 2021-08-13 MED ORDER — OXYTOCIN-SODIUM CHLORIDE 30-0.9 UT/500ML-% IV SOLN
2.5000 [IU]/h | INTRAVENOUS | Status: AC
Start: 2021-08-13 — End: 2021-08-13
  Administered 2021-08-13: 2.5 [IU]/h via INTRAVENOUS
  Filled 2021-08-13: qty 500

## 2021-08-13 MED ORDER — LABETALOL HCL 5 MG/ML IV SOLN
80.0000 mg | INTRAVENOUS | Status: DC | PRN
  Filled 2021-08-13: qty 16

## 2021-08-13 MED ORDER — KETAMINE HCL 50 MG/ML IJ SOLN
INTRAMUSCULAR | Status: AC
  Filled 2021-08-13: qty 10

## 2021-08-13 MED ORDER — MEPERIDINE HCL 25 MG/ML IJ SOLN: 6.2500 mg | INTRAMUSCULAR | Status: DC | PRN

## 2021-08-13 MED ORDER — LACTATED RINGERS IV SOLN: INTRAVENOUS | Status: DC

## 2021-08-13 MED ORDER — ZOLPIDEM TARTRATE 5 MG PO TABS: 5.0000 mg | ORAL_TABLET | Freq: Every evening | ORAL | Status: DC | PRN

## 2021-08-13 MED ORDER — DIBUCAINE (PERIANAL) 1 % EX OINT: 1.0000 | TOPICAL_OINTMENT | CUTANEOUS | Status: DC | PRN

## 2021-08-13 MED ORDER — DEXMEDETOMIDINE (PRECEDEX) IN NS 20 MCG/5ML (4 MCG/ML) IV SYRINGE: PREFILLED_SYRINGE | INTRAVENOUS | Status: DC | PRN

## 2021-08-13 MED ORDER — PHENYLEPHRINE 80 MCG/ML (10ML) SYRINGE FOR IV PUSH (FOR BLOOD PRESSURE SUPPORT)
PREFILLED_SYRINGE | INTRAVENOUS | Status: DC | PRN
  Administered 2021-08-13 (×3): 80 ug via INTRAVENOUS

## 2021-08-13 MED ORDER — MAGNESIUM SULFATE 40 GM/1000ML IV SOLN
2.0000 g/h | INTRAVENOUS | Status: DC
  Administered 2021-08-13 (×2): 2 g/h via INTRAVENOUS
  Filled 2021-08-13: qty 1000

## 2021-08-13 MED ORDER — FENTANYL CITRATE (PF) 100 MCG/2ML IJ SOLN
INTRAMUSCULAR | Status: DC | PRN
Start: 2021-08-13 — End: 2021-08-13
  Administered 2021-08-13: 10 ug via INTRATHECAL

## 2021-08-13 MED ORDER — NIFEDIPINE ER OSMOTIC RELEASE 30 MG PO TB24
30.0000 mg | ORAL_TABLET | Freq: Every day | ORAL | Status: DC
  Administered 2021-08-13 – 2021-08-14 (×2): 30 mg via ORAL
  Filled 2021-08-13 (×2): qty 1

## 2021-08-13 MED ORDER — SODIUM CHLORIDE 0.9 % IV SOLN
INTRAVENOUS | Status: AC
  Filled 2021-08-13: qty 5

## 2021-08-13 MED ORDER — ACETAMINOPHEN 500 MG PO TABS
1000.0000 mg | ORAL_TABLET | Freq: Four times a day (QID) | ORAL | Status: DC
  Administered 2021-08-13 – 2021-08-16 (×9): 1000 mg via ORAL
  Filled 2021-08-13 (×9): qty 2

## 2021-08-13 MED ORDER — WITCH HAZEL-GLYCERIN EX PADS: 1.0000 | MEDICATED_PAD | CUTANEOUS | Status: DC | PRN

## 2021-08-13 MED ORDER — ONDANSETRON HCL 4 MG/2ML IJ SOLN
INTRAMUSCULAR | Status: DC | PRN
  Administered 2021-08-13 (×2): 4 mg via INTRAVENOUS

## 2021-08-13 MED ORDER — MISOPROSTOL 200 MCG PO TABS
ORAL_TABLET | ORAL | Status: AC
  Filled 2021-08-13: qty 4

## 2021-08-13 MED ORDER — LABETALOL HCL 5 MG/ML IV SOLN
20.0000 mg | INTRAVENOUS | Status: DC | PRN
  Filled 2021-08-13: qty 4

## 2021-08-13 MED ORDER — CEFAZOLIN SODIUM-DEXTROSE 2-3 GM-%(50ML) IV SOLR
INTRAVENOUS | Status: DC | PRN
  Administered 2021-08-13: 2 g via INTRAVENOUS

## 2021-08-13 MED ORDER — OXYCODONE HCL 5 MG PO TABS: 5.0000 mg | ORAL_TABLET | Freq: Four times a day (QID) | ORAL | Status: DC | PRN

## 2021-08-13 MED ORDER — DIPHENHYDRAMINE HCL 50 MG/ML IJ SOLN: 12.5000 mg | INTRAMUSCULAR | Status: DC | PRN

## 2021-08-13 MED ORDER — NALOXONE HCL 0.4 MG/ML IJ SOLN: 0.4000 mg | INTRAMUSCULAR | Status: DC | PRN

## 2021-08-13 MED ORDER — PHENYLEPHRINE HCL-NACL 20-0.9 MG/250ML-% IV SOLN
INTRAVENOUS | Status: DC | PRN
  Administered 2021-08-13: 13.333 ug/min via INTRAVENOUS

## 2021-08-13 MED ORDER — SODIUM CHLORIDE (PF) 0.9 % IJ SOLN
INTRAMUSCULAR | Status: AC
  Filled 2021-08-13: qty 50

## 2021-08-13 MED ORDER — HYDRALAZINE HCL 20 MG/ML IJ SOLN
10.0000 mg | INTRAMUSCULAR | Status: DC | PRN
  Filled 2021-08-13: qty 0.5

## 2021-08-13 SURGICAL SUPPLY — 30 items
BACTOSHIELD CHG 4% 4OZ (MISCELLANEOUS) ×1
BARRIER ADHS 3X4 INTERCEED (GAUZE/BANDAGES/DRESSINGS) ×3 IMPLANT
BRR ADH 4X3 ABS CNTRL BYND (GAUZE/BANDAGES/DRESSINGS) ×2
CHLORAPREP W/TINT 26 (MISCELLANEOUS) ×2 IMPLANT
DRSG TELFA 3X8 NADH (GAUZE/BANDAGES/DRESSINGS) ×2 IMPLANT
ELECT CAUTERY BLADE 6.4 (BLADE) ×2 IMPLANT
ELECT REM PT RETURN 9FT ADLT (ELECTROSURGICAL) ×2
ELECTRODE REM PT RTRN 9FT ADLT (ELECTROSURGICAL) ×1 IMPLANT
GAUZE SPONGE 4X4 12PLY STRL (GAUZE/BANDAGES/DRESSINGS) ×2 IMPLANT
GLOVE SURG SYN 8.0 (GLOVE) ×2 IMPLANT
GLOVE SURG SYN 8.0 PF PI (GLOVE) ×1 IMPLANT
GOWN STRL REUS W/ TWL LRG LVL3 (GOWN DISPOSABLE) ×2 IMPLANT
GOWN STRL REUS W/ TWL XL LVL3 (GOWN DISPOSABLE) ×1 IMPLANT
GOWN STRL REUS W/TWL LRG LVL3 (GOWN DISPOSABLE) ×4
GOWN STRL REUS W/TWL XL LVL3 (GOWN DISPOSABLE) ×2
MANIFOLD NEPTUNE II (INSTRUMENTS) ×2 IMPLANT
MAT PREVALON FULL STRYKER (MISCELLANEOUS) ×2 IMPLANT
NEEDLE HYPO 22GX1.5 SAFETY (NEEDLE) ×2 IMPLANT
NS IRRIG 1000ML POUR BTL (IV SOLUTION) ×2 IMPLANT
PACK C SECTION AR (MISCELLANEOUS) ×2 IMPLANT
PAD DRESSING TELFA 3X8 NADH (GAUZE/BANDAGES/DRESSINGS) ×1 IMPLANT
PAD OB MATERNITY 4.3X12.25 (PERSONAL CARE ITEMS) ×2 IMPLANT
PAD PREP 24X41 OB/GYN DISP (PERSONAL CARE ITEMS) ×2 IMPLANT
SCRUB CHG 4% DYNA-HEX 4OZ (MISCELLANEOUS) ×1 IMPLANT
STRAP SAFETY 5IN WIDE (MISCELLANEOUS) ×2 IMPLANT
SUT CHROMIC 1 CTX 36 (SUTURE) ×6 IMPLANT
SUT PLAIN GUT 0 (SUTURE) ×4 IMPLANT
SUT VIC AB 0 CT1 36 (SUTURE) ×4 IMPLANT
SYR 30ML LL (SYRINGE) ×4 IMPLANT
WATER STERILE IRR 500ML POUR (IV SOLUTION) ×2 IMPLANT

## 2021-08-13 NOTE — Progress Notes (Signed)
Post Partum Day 0  Subjective: Doing well, tired. Has been assisted OOB to chair, Foley draining dark yellow urine. Has blurred vision.   No CP SOB Fever,Chills, N/V or leg pain; denies nipple or breast pain; no HA, RUQ/epigastric pain  Objective: BP (!) 132/94   Pulse 77   Temp (!) 96.2 F (35.7 C) (Axillary)   Resp 16   Ht 5\' 3"  (1.6 m)   Wt 100.7 kg   LMP 11/24/2020 (Exact Date)   SpO2 100%   Breastfeeding Unknown   BMI 39.33 kg/m    Vitals:   08/13/21 0831 08/13/21 0901 08/13/21 0931 08/13/21 0956  BP: (!) 147/105 (!) 148/99 (!) 142/99 (!) 142/99   08/13/21 1001 08/13/21 1031 08/13/21 1101 08/13/21 1131  BP: (!) 153/105 (!) 133/96 (!) 128/91 (!) 130/92   08/13/21 1201 08/13/21 1231 08/13/21 1354 08/13/21 1501  BP: (!) 124/93 122/83 (!) 143/96 (!) 132/94   Total I/O In: 1723 [P.O.:580; I.V.:1143] Out: 349 [Urine:349] Hourly Since 0700: 50-31-35-32-21-70-110-20    Physical Exam:  General: NAD Breasts: soft/nontender CV: RRR Pulm: nl effort, CTABL Abdomen: soft, NT, BS x 4 Incision: Dsg CDI/Steristrips intact/no erythema or drainage  Lochia: small Uterine Fundus: fundus firm and 1 fb below umbilicus DVT Evaluation: no cords, ttp LEs, 2+ DTR, no clonus bilaterally.  Bilateral 2+ edema to knees  Recent Labs    08/12/21 2335 08/13/21 0717  HGB 12.2 10.8*  HCT 38.3 34.2*  WBC 15.5* 17.5*  PLT 97* 90*   Component     Latest Ref Rng 08/13/2021  Sodium     135 - 145 mmol/L 133 (L)   Potassium     3.5 - 5.1 mmol/L 3.4 (L)   Chloride     98 - 111 mmol/L 106   CO2     22 - 32 mmol/L 19 (L)   Glucose     70 - 99 mg/dL 10/14/2021 (H)   BUN     6 - 20 mg/dL 6   Creatinine     564 - 1.00 mg/dL 3.32   Calcium     8.9 - 10.3 mg/dL 7.1 (L)   Total Protein     6.5 - 8.1 g/dL 4.9 (L)   Albumin     3.5 - 5.0 g/dL 1.7 (L)   AST     15 - 41 U/L 30   ALT     0 - 44 U/L 11   Alkaline Phosphatase     38 - 126 U/L 182 (H)   Total Bilirubin     0.3 - 1.2 mg/dL 0.4    GFR, Estimated     >60 mL/min >60   Anion gap     5 - 15  8     Legend: (L) Low (H) High  Assessment/Plan: 26 y.o. G1P1001 postpartum day # 0 S/p LTCS Pre-Eclampsia with severe features  - Continue routine PP care, encouraged OOB, TED hose and SCD in place -Pre-eclampsia:   Continue Mag sulfate at 2gm/hr, mag level WNL this am.   Pitocin dc this am, normal bleeding with continued low urinary output.   Severe range BPs continued overnight, reviewed with Dr 34, change to PO- Labetalol 400mg  TID and Procardia 30mg  XL started.   Urine output: borderline, last creatinine this AM: 0.66; called TJS with update regarding hourly urine, LR bolus Feliberto Gottron now and repeat in 1 hour ordered.   - Acute blood loss anemia - hemodynamically stable and asymptomatic; start po ferrous sulfate  BID with stool softeners     Disposition: remain on L&D with mag x 24hours.     Randa Ngo, CNM 08/13/2021  3:06 PM

## 2021-08-13 NOTE — Anesthesia Procedure Notes (Signed)
Spinal  Patient location during procedure: OR Start time: 08/13/2021 12:31 AM End time: 08/13/2021 12:32 AM Reason for block: surgical anesthesia Staffing Performed: anesthesiologist  Anesthesiologist: Tera Mater, MD Resident/CRNA: Aline Brochure, CRNA Performed by: Aline Brochure, CRNA Authorized by: Tera Mater, MD   Preanesthetic Checklist Completed: patient identified, IV checked, site marked, risks and benefits discussed, surgical consent, monitors and equipment checked, pre-op evaluation and timeout performed Spinal Block Patient position: sitting Prep: ChloraPrep Patient monitoring: heart rate, continuous pulse ox, blood pressure and cardiac monitor Approach: midline Location: L3-4 Injection technique: single-shot Needle Needle type: Whitacre and Introducer  Needle gauge: 24 G Needle length: 9 cm Assessment Sensory level: T10 Events: CSF return Additional Notes Sterile aseptic technique used throughout the procedure.  Negative paresthesia. Negative blood return. Positive free-flowing CSF. Expiration date of kit checked and confirmed. Patient tolerated procedure well, without complications.

## 2021-08-13 NOTE — Progress Notes (Signed)
CSW contacted patient but patients fiance was in the room and patient had CSW on speaker phone. CSW spoke with patients nurse who stated patient is not very alert at this time. Patient is currently on Magnesium. CSW will leave a handoff to speak with patient tomorrow when she is more alert. Patient also gave birth to her son earlier this morning.

## 2021-08-13 NOTE — Progress Notes (Signed)
We are not able to control BP despite mutiple dosing of labetalol and hydralazine . Given we are remote from delivery I have recommend primary LTCS. I have counseled the pt regarding the risks of the procedure . All questions have been answered .  Proceed .

## 2021-08-13 NOTE — Transfer of Care (Signed)
Immediate Anesthesia Transfer of Care Note  Patient: Lauren Cunningham  Procedure(s) Performed: CESAREAN SECTION  Patient Location: LDR1  Anesthesia Type:Spinal  Level of Consciousness: drowsy  Airway & Oxygen Therapy: Patient Spontanous Breathing  Post-op Assessment: Report given to RN and Post -op Vital signs reviewed and stable  Post vital signs: Reviewed and stable  Last Vitals:  Vitals Value Taken Time  BP 128/93   Temp    Pulse 81   Resp 13   SpO2 95     Last Pain:  Vitals:   08/12/21 1915  TempSrc: Oral  PainSc:          Complications: No notable events documented.

## 2021-08-13 NOTE — Progress Notes (Signed)
Unable to register a temp on pt. Attempted oral and axillary with no success. Bear hugger applied @ [id number]. Attempted again about 15 minutes after start and still unable to register temp. Will continue to assess. McVey, CNM aware. Elaina Hoops

## 2021-08-13 NOTE — Brief Op Note (Signed)
08/12/2021 - 08/13/2021  1:11 AM  PATIENT:  Lauren Cunningham  26 y.o. female  PRE-OPERATIVE DIAGNOSIS:  severe preeclampsia with uncontrolable BP  POST-OPERATIVE DIAGNOSIS:  same PROCEDURE:  Procedure(s): CESAREAN SECTION LTCS  SURGEON:  Surgeon(s) and Role:    * Maghan Jessee, Ihor Austin, MD - Primary  PHYSICIAN ASSISTANT: Rubye Oaks , CNM   ASSISTANTS: none   ANESTHESIA:   spinal  EBL: qbl = 5050 cc IOF 600 cc BLOOD ADMINISTERED:none  DRAINS: Urinary Catheter (Foley)   LOCAL MEDICATIONS USED:  MARCAINE     SPECIMEN:   DISPOSITION OF SPECIMEN:  N/A  COUNTS:  YES  TOURNIQUET:  * No tourniquets in log *  DICTATION: .Other Dictation: Dictation Number verbal  PLAN OF CARE: Admit to inpatient   PATIENT DISPOSITION:  PACU - hemodynamically stable.   Delay start of Pharmacological VTE agent (>24hrs) due to surgical blood loss or risk of bleeding: not applicable

## 2021-08-13 NOTE — Progress Notes (Signed)
Post Partum Day 0  Subjective: Feels very tired. Has been assisted OOB to chair x 1, getting back up now with RN assist. Foley draining clear yellow urine. Has blurred vision.   No CP SOB Fever,Chills, N/V or leg pain; denies nipple or breast pain; no HA, RUQ/epigastric pain  Objective: BP (!) 131/93   Pulse 76   Temp (!) 97.3 F (36.3 C) (Oral)   Resp 16   Ht 5\' 3"  (1.6 m)   Wt 100.7 kg   LMP 11/24/2020 (Exact Date)   SpO2 99%   Breastfeeding Unknown   BMI 39.33 kg/m    Vitals:   08/13/21 1131 08/13/21 1201 08/13/21 1231 08/13/21 1301  BP: (!) 130/92 (!) 124/93 122/83 (!) 142/100   08/13/21 1354 08/13/21 1401 08/13/21 1501 08/13/21 1601  BP: (!) 143/96 (!) 137/95 (!) 132/94 123/80   08/13/21 1701 08/13/21 1727 08/13/21 1801 08/13/21 1900  BP: (!) 134/93 (!) 134/93 (!) 136/94 (!) 131/93   Intake/Output      07/01 0701 07/02 0700   P.O. 760   I.V. (mL/kg) 1723.1 (17.1)   IV Piggyback 566.4   Total Intake(mL/kg) 3049.4 (30.3)   Urine (mL/kg/hr) 554 (0.4)   Blood    Total Output 554   Net +2495.4          Output: 09/02 over last 4hrs.    Physical Exam:  General: NAD Breasts: soft/nontender CV: RRR Pulm: nl effort, CTABL Abdomen: soft, NT, BS x 4 Incision: Dsg CDI/Steristrips intact/no erythema or drainage Lochia: small Uterine Fundus: fundus firm and 1 fb below umbilicus DVT Evaluation: no cords, ttp LEs, 2+ DTR, no clonus bilaterally.  Bilateral 2+ edema to above knees; TED and SCDs in place.   Recent Labs    08/12/21 2335 08/13/21 0717  HGB 12.2 10.8*  HCT 38.3 34.2*  WBC 15.5* 17.5*  PLT 97* 90*   Component     Latest Ref Rng 08/13/2021  Sodium     135 - 145 mmol/L 133 (L)   Potassium     3.5 - 5.1 mmol/L 3.4 (L)   Chloride     98 - 111 mmol/L 106   CO2     22 - 32 mmol/L 19 (L)   Glucose     70 - 99 mg/dL 10/14/2021 (H)   BUN     6 - 20 mg/dL 6   Creatinine     423 - 1.00 mg/dL 5.36   Calcium     8.9 - 10.3 mg/dL 7.1 (L)   Total Protein      6.5 - 8.1 g/dL 4.9 (L)   Albumin     3.5 - 5.0 g/dL 1.7 (L)   AST     15 - 41 U/L 30   ALT     0 - 44 U/L 11   Alkaline Phosphatase     38 - 126 U/L 182 (H)   Total Bilirubin     0.3 - 1.2 mg/dL 0.4   GFR, Estimated     >60 mL/min >60   Anion gap     5 - 15  8     Legend: (L) Low (H) High  Assessment/Plan: 26 y.o. G1P1001 postpartum day # 0 S/p LTCS Pre-Eclampsia with severe features  - Continue routine PP care, encouraged OOB, TED hose and SCD in place -Pre-eclampsia:   Continue Mag sulfate at 2gm/hr, mag level WNL this am, plan to continue until diuresing well per Dr 34.   Pitocin dc  this am, normal bleeding with continued low urinary output.   BP remaining stable in mild range: 130s/90s; continue Labetalol 400mg  TID, Procardia 30mg  XL  Urine output: continues to be low. Updated Dr . Instructed to call if urine output <1109ml/hr, may repeat Feliberto Gottron bolus again if less than 144ml/4hrs.    Disposition: remain on L&D    , CNM 08/13/2021  7:47 PM
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
## 2021-08-13 NOTE — Op Note (Signed)
Lauren Cunningham, Lauren Cunningham MEDICAL RECORD NO: [id number] ACCOUNT NO: [id number] DATE OF BIRTH: 07-23-1995 FACILITY: [facility] LOCATION: [facility] PHYSICIAN: Suzy Bouchard, MD  Operative Report   DATE OF PROCEDURE: 08/13/2021  PREOPERATIVE DIAGNOSES:  1.  37+3 weeks estimated gestational age. 2.  Severe preeclampsia with uncontrollable blood pressure.  POSTOPERATIVE DIAGNOSES:   1.  37+3 weeks estimated gestational age. 2.  Severe preeclampsia with uncontrollable blood pressure. 3. Vigorous female, delivered.  PROCEDURE:  Primary low transverse cesarean section.  SURGEON:  Suzy Bouchard, MD  FIRST ASSISTANTRubye Oaks, certified nurse midwife.  ANESTHESIA:  Spinal.  INDICATIONS:  A 26 year old gravida 1, para 0, patient admitted to labor and delivery with severe range blood pressures and thrombocytopenia and significant headache and vision changes.  The patient's labor was induced and despite multiple dosing of  hydralazine and labetalol, the patient's blood pressures remained in severe criteria.  The patient was remote from delivery; therefore, a primary cesarean section was opted for to expedite recovery and uncontrollable blood pressures.  DESCRIPTION OF PROCEDURE:  After adequate spinal anesthesia, the patient was placed in dorsal supine position with hip roll on the right side.  The patient's abdomen, perineum and vagina were prepped and draped in normal sterile fashion.  Timeout was  performed.  The patient did receive 2 grams of IV Ancef for surgical prophylaxis prior to commencement.  Pfannenstiel incision was made 2 fingerbreadths above the symphysis pubis.  Sharp dissection was used to identify the fascia.  Fascia was opened in  the midline and opened in a transverse fashion.  Superior aspect of the fascia was grasped with Kocher clamps and the recti muscles were dissected free.  Inferior aspect of the fascia was grasped with Kocher clamps.  Pyramidalis muscle was  dissected  free.  Entry into the peritoneal cavity was accomplished sharply.  The vesicouterine peritoneal fold was identified and a bladder flap was created and the bladder was reflected inferiorly.  Low transverse uterine incision was made.  Upon entry into the  endometrial cavity, clear fluid resulted.  Fetal head was brought to the incision and with fundal pressure, head, shoulders and body allowed for delivery of a vigorous female.  Female was dried and suctioned on the mother's abdomen for 60 seconds and then  cord was doubly clamped and vigorous female was passed to nursery staff who assigned Apgar scores of 8 and 8.  Time of birth is 00:49.  The placenta was then manually delivered and the uterus was exteriorized.  Endometrial cavity was wiped clean with  laparotomy tape.  The uterine incision was closed with 1 chromic suture in a running locking fashion, good approximation of edges.  Good hemostasis noted.  Fallopian tubes and ovaries appeared normal.  Posterior cul-de-sac was irrigated and suctioned,  and the uterus was placed back into the abdominal cavity.  Pericolic gutters were then wiped clean with laparotomy tape.  Uterine incision again appeared hemostatic.  Interceed was placed over the uterine incision in T-shape fashion.  Fascia was then  closed with a 0 Vicryl suture running nonlocking fashion, additional reinforcement with additional 0 Vicryl in the central portion of the incision was utilized.  Subcutaneous tissues were irrigated.  Fascial edges were injected with a Marcaine solution  of 60 mL of 0.5% Marcaine plus 20 mL normal saline.  40 mL of the solution was utilized.  Subcutaneous tissues were irrigated and bovied for hemostasis and the skin was reapproximated with Insorb absorbable staples.  Good cosmetic effect.  QUANTITATIVE BLOOD LOSS:  505 mL.  URINE OUTPUT:  50cc  INTRAOPERATIVE FLUIDS:  600 mL.  DISPOSITION:  The patient was taken to recovery room in good  condition.   VAI D: 08/13/2021 1:30:20 am T: 08/13/2021 2:49:00 am  JOB: [id number]/ [id number]

## 2021-08-14 LAB — CBC
HCT: 28.1 % — ABNORMAL LOW (ref 36.0–46.0)
Hemoglobin: 9.2 g/dL — ABNORMAL LOW (ref 12.0–15.0)
MCH: 27.7 pg (ref 26.0–34.0)
MCHC: 32.7 g/dL (ref 30.0–36.0)
MCV: 84.6 fL (ref 80.0–100.0)
Platelets: 93 10*3/uL — ABNORMAL LOW (ref 150–400)
RBC: 3.32 MIL/uL — ABNORMAL LOW (ref 3.87–5.11)
RDW: 16.1 % — ABNORMAL HIGH (ref 11.5–15.5)
WBC: 14.3 10*3/uL — ABNORMAL HIGH (ref 4.0–10.5)
nRBC: 0 % (ref 0.0–0.2)

## 2021-08-14 LAB — COMPREHENSIVE METABOLIC PANEL
ALT: 12 U/L (ref 0–44)
AST: 26 U/L (ref 15–41)
Albumin: 1.6 g/dL — ABNORMAL LOW (ref 3.5–5.0)
Alkaline Phosphatase: 148 U/L — ABNORMAL HIGH (ref 38–126)
Anion gap: 6 (ref 5–15)
BUN: 5 mg/dL — ABNORMAL LOW (ref 6–20)
CO2: 24 mmol/L (ref 22–32)
Calcium: 6.6 mg/dL — ABNORMAL LOW (ref 8.9–10.3)
Chloride: 106 mmol/L (ref 98–111)
Creatinine, Ser: 0.73 mg/dL (ref 0.44–1.00)
GFR, Estimated: >60 mL/min (ref >60)
Glucose, Bld: 112 mg/dL — ABNORMAL HIGH (ref 70–99)
Potassium: 3.2 mmol/L — ABNORMAL LOW (ref 3.5–5.1)
Sodium: 136 mmol/L (ref 135–145)
Total Bilirubin: 0.3 mg/dL (ref 0.3–1.2)
Total Protein: 4.5 g/dL — ABNORMAL LOW (ref 6.5–8.1)

## 2021-08-14 MED ORDER — NIFEDIPINE ER OSMOTIC RELEASE 30 MG PO TB24
30.0000 mg | ORAL_TABLET | Freq: Once | ORAL | Status: AC
  Administered 2021-08-14: 30 mg via ORAL
  Filled 2021-08-14: qty 1

## 2021-08-14 MED ORDER — POTASSIUM CHLORIDE CRYS ER 20 MEQ PO TBCR
40.0000 meq | EXTENDED_RELEASE_TABLET | Freq: Once | ORAL | Status: AC
Start: 2021-08-14 — End: 2021-08-14
  Administered 2021-08-14: 40 meq via ORAL

## 2021-08-14 MED ORDER — POTASSIUM CHLORIDE CRYS ER 20 MEQ PO TBCR
40.0000 meq | EXTENDED_RELEASE_TABLET | Freq: Two times a day (BID) | ORAL | Status: DC
  Filled 2021-08-14: qty 4
  Filled 2021-08-14: qty 2

## 2021-08-14 MED ORDER — NIFEDIPINE ER OSMOTIC RELEASE 30 MG PO TB24: 60.0000 mg | ORAL_TABLET | Freq: Every day | ORAL | Status: DC

## 2021-08-14 NOTE — Anesthesia Post-op Follow-up Note (Signed)
  Anesthesia Pain Follow-up Note  Patient: Lauren Cunningham  Day #: 1  Date of Follow-up: 08/14/2021 Time: 10:43 AM  Last Vitals:  Vitals:   08/14/21 0609 08/14/21 0832  BP: 113/69 128/77  Pulse: 92 98  Resp: 16 18  Temp: 36.7 C 36.9 C  SpO2:      Level of Consciousness: alert  Pain: mild   Side Effects:None  Catheter Site Exam:clean, dry     Plan: D/C from anesthesia care at surgeon's request  Lenard Simmer

## 2021-08-14 NOTE — Anesthesia Postprocedure Evaluation (Signed)
Anesthesia Post Note  Patient: Lauren Cunningham  Procedure(s) Performed: CESAREAN SECTION  Patient location during evaluation: Mother Baby Anesthesia Type: Spinal Level of consciousness: oriented and awake and alert Pain management: pain level controlled Vital Signs Assessment: post-procedure vital signs reviewed and stable Respiratory status: spontaneous breathing and respiratory function stable Cardiovascular status: blood pressure returned to baseline and stable Postop Assessment: no headache, no backache, no apparent nausea or vomiting and able to ambulate Anesthetic complications: no   No notable events documented.   Last Vitals:  Vitals:   08/14/21 0609 08/14/21 0832  BP: 113/69 128/77  Pulse: 92 98  Resp: 16 18  Temp: 36.7 C 36.9 C  SpO2:      Last Pain:  Vitals:   08/14/21 0832  TempSrc: Oral  PainSc: 7                  Lenard Simmer

## 2021-08-14 NOTE — Progress Notes (Signed)
Post Partum Day 1  Subjective: Ambulating well today with assist, participating in newborn care and generally feels better today. Sitting up in chair at this time. Tolerating PO pain meds and regular diet.   No CP SOB Fever,Chills, N/V or leg pain; denies nipple or breast pain; no HA, RUQ/epigastric pain  Objective: BP 128/77 (BP Location: Right Arm)   Pulse 98   Temp 98.4 F (36.9 C) (Oral)   Resp 18   Ht 5\' 3"  (1.6 m)   Wt 100.7 kg   LMP 11/24/2020 (Exact Date)   SpO2 99%   Breastfeeding Unknown   BMI 39.33 kg/m    Vitals:   08/13/21 2345 08/14/21 0058 08/14/21 0100 08/14/21 0230  BP: 118/77 113/80 116/75 119/80   08/14/21 0255 08/14/21 0300 08/14/21 0345 08/14/21 0401  BP: 125/85 117/85 126/82 125/83   08/14/21 0447 08/14/21 0501 08/14/21 0609 08/14/21 0832  BP: 126/85 117/80 113/69 128/77    Intake/Output Summary (Last 24 hours) at 08/14/2021 1107 Last data filed at 08/14/2021 0830 Gross per 24 hour  Intake 4257.25 ml  Output 4279 ml  Net -21.75 ml       Physical Exam:  General: NAD Breasts: soft/nontender CV: RRR Pulm: nl effort, CTABL Abdomen: soft, NT, BS x 4 Incision: Dsg CDI, no  drainage Lochia: small Uterine Fundus: fundus firm and 1 fb below umbilicus DVT Evaluation: no cords, ttp LEs, 2+ DTR, no clonus bilaterally.  Bilateral 2+ edema to above knees; TED and SCDs in place.   Recent Labs    08/13/21 0717 08/14/21 0637  HGB 10.8* 9.2*  HCT 34.2* 28.1*  WBC 17.5* 14.3*  PLT 90* 93*   Component     Latest Ref Rng 08/13/2021 08/14/2021  Sodium     135 - 145 mmol/L 133 (L)  136   Potassium     3.5 - 5.1 mmol/L 3.4 (L)  3.2 (L)   Chloride     98 - 111 mmol/L 106  106   CO2     22 - 32 mmol/L 19 (L)  24   Glucose     70 - 99 mg/dL 10/15/2021 (H)  629 (H)   BUN     6 - 20 mg/dL 6  5 (L)   Creatinine     0.44 - 1.00 mg/dL 528  4.13   Calcium     8.9 - 10.3 mg/dL 7.1 (L)  6.6 (L)   Total Protein     6.5 - 8.1 g/dL 4.9 (L)  4.5 (L)   Albumin     3.5  - 5.0 g/dL 1.7 (L)  1.6 (L)   AST     15 - 41 U/L 30  26   ALT     0 - 44 U/L 11  12   Alkaline Phosphatase     38 - 126 U/L 182 (H)  148 (H)   Total Bilirubin     0.3 - 1.2 mg/dL 0.4  0.3   GFR, Estimated     >60 mL/min >60  >60   Anion gap     5 - 15  8  6      Legend: (L) Low (H) High  Assessment/Plan: 26 y.o. G1P1001 postpartum day # 1 S/p LTCS Pre-Eclampsia with severe features  - Continue routine PP care, encouraged OOB, TED hose in place; Needs honeycomb dressing applied.  -Pre-eclampsia:   Mag DC around 0400 after several hours of diuresis.   BP remaining normotensive now,  continue  Labetalol 400mg  TID, Procardia 30mg  XL  Urine output: improved, diuresing well. Foley removed, pending 1st void. Continue strict I/O    Disposition: transfer to PP unit    , CNM 08/14/2021  11:07 AM

## 2021-08-14 NOTE — Progress Notes (Signed)
Post Partum Day 1  Subjective: Ambulating well today with assist, participating in newborn care and generally feels better. Tolerating PO pain meds and regular diet. Having increased gas pain now.   No CP SOB Fever,Chills, N/V or leg pain; denies nipple or breast pain; no HA, RUQ/epigastric pain  Objective: BP (!) 153/106 (BP Location: Left Arm) Comment: nurse Annie notified  Pulse 96   Temp 98.8 F (37.1 C) (Oral)   Resp 16   Ht 5\' 3"  (1.6 m)   Wt 100.7 kg   LMP 11/24/2020 (Exact Date)   SpO2 99%   Breastfeeding Unknown   BMI 39.33 kg/m    Vitals:   08/14/21 0255 08/14/21 0300 08/14/21 0345 08/14/21 0401  BP: 125/85 117/85 126/82 125/83   08/14/21 0447 08/14/21 0501 08/14/21 0609 08/14/21 0832  BP: 126/85 117/80 113/69 128/77   08/14/21 1051 08/14/21 1052 08/14/21 1423 08/14/21 1603  BP: (!) 140/95 140/86 (!) 136/97 (!) 153/106    Intake/Output Summary (Last 24 hours) at 08/14/2021 1639 Last data filed at 08/14/2021 1636 Gross per 24 hour  Intake 3374.38 ml  Output 5058 ml  Net -1683.62 ml       Physical Exam:  General: NAD Breasts: soft/nontender CV: RRR Pulm: nl effort, CTABL Abdomen: soft, NT, BS x 4 Incision: Dsg CDI, no  drainage Lochia: small Uterine Fundus: fundus firm and 1 fb below umbilicus DVT Evaluation: no cords, ttp LEs, 2+ DTR, no clonus bilaterally.  Bilateral 2+ edema to above knees; TED and SCDs in place.   Recent Labs    08/13/21 0717 08/14/21 0637  HGB 10.8* 9.2*  HCT 34.2* 28.1*  WBC 17.5* 14.3*  PLT 90* 93*   Component     Latest Ref Rng 08/13/2021 08/14/2021  Sodium     135 - 145 mmol/L 133 (L)  136   Potassium     3.5 - 5.1 mmol/L 3.4 (L)  3.2 (L)   Chloride     98 - 111 mmol/L 106  106   CO2     22 - 32 mmol/L 19 (L)  24   Glucose     70 - 99 mg/dL 10/15/2021 (H)  299 (H)   BUN     6 - 20 mg/dL 6  5 (L)   Creatinine     0.44 - 1.00 mg/dL 242  6.83   Calcium     8.9 - 10.3 mg/dL 7.1 (L)  6.6 (L)   Total Protein     6.5 - 8.1  g/dL 4.9 (L)  4.5 (L)   Albumin     3.5 - 5.0 g/dL 1.7 (L)  1.6 (L)   AST     15 - 41 U/L 30  26   ALT     0 - 44 U/L 11  12   Alkaline Phosphatase     38 - 126 U/L 182 (H)  148 (H)   Total Bilirubin     0.3 - 1.2 mg/dL 0.4  0.3   GFR, Estimated     >60 mL/min >60  >60   Anion gap     5 - 15  8  6      Legend: (L) Low (H) High  Assessment/Plan 25 y.o. G1P1001 postpartum day # 1 S/p LTCS Pre-Eclampsia with severe features  - Continue routine PP care, TED hose in place; honeycomb dressing applied.  -Pre-eclampsia:   BP mild range 140-150/90s;  continue Labetalol 400mg  TID, increase to Procardia 60mg  XL. Discussed with  Dr Feliberto Gottron  Urine output: improved, diuresing well. Continues good urine output. Continue strict I/O    Disposition: continue inpatient  Randa Ngo, CNM 08/14/2021  4:39 PM

## 2021-08-14 NOTE — Lactation Note (Signed)
This note was copied from a baby's chart. Lactation Consultation Note  Patient Name: Lauren Cunningham IZTIW'P Date: 08/14/2021 Reason for consult: Follow-up assessment;Primapara;Early term 37-38.6wks Age:28 hours  Maternal Data Has patient been taught Hand Expression?: Yes Does the patient have breastfeeding experience prior to this delivery?: No  Feeding Mother's Current Feeding Choice: Breast Milk Nipple Type: Slow - flow  LATCH Score Latch: Grasps breast easily, tongue down, lips flanged, rhythmical sucking.  Audible Swallowing: A few with stimulation  Type of Nipple: Everted at rest and after stimulation  Comfort (Breast/Nipple): Soft / non-tender  Hold (Positioning): Full assist, staff holds infant at breast  LATCH Score: 7   Lactation Tools Discussed/Used    Interventions Interventions: Breast feeding basics reviewed;Assisted with latch;Hand express;Support pillows;Education  Discharge Pump: DEBP WIC Program: Yes  Consult Status Consult Status: Follow-up Date: 08/15/21 Follow-up type: In-patient    [name] 08/14/2021, 2:43 PM

## 2021-08-14 NOTE — Progress Notes (Signed)
FOB asking patient edinburgh questions out loud to patient. Patient telling FOB her answers.  Educated patient and FOB that edinburgh is for patient to fill out by herself to the best of her ability. FOB handed edinburgh to RN and patient unable to answer edinburgh questions privately or confidentially.

## 2021-08-14 NOTE — Lactation Note (Signed)
This note was copied from a baby's chart. Lactation Consultation Note  Patient Name: Lauren Cunningham [id number] Date: 08/14/2021 Reason for consult: Follow-up assessment;Primapara;Early term 37-38.6wks Age:28 hours  Maternal Data Has patient been taught Hand Expression?: Yes  Feeding Mother's Current Feeding Choice: Breast Milk Nipple Type: Slow - flow Mom and baby have been moved to room on mother baby unit, mom very sleepy, needing much assist with breastfeeding, baby has been sleepy but more alert now, latched easily to left breast in cradle hold and nursed x 5 min with much stimulation to suck, burped and moved to right breast in football hold, latched well and is more alert on this side, still needs some stimulation, to suck, nursed 10 min. FOB to supplement formula after this feed.                            LATCH Score Latch: Grasps breast easily, tongue down, lips flanged, rhythmical sucking.  Audible Swallowing: A few with stimulation  Type of Nipple: Everted at rest and after stimulation  Comfort (Breast/Nipple): Soft / non-tender  Hold (Positioning): Full assist, staff holds infant at breast  LATCH Score: 7   Lactation Tools Discussed/Used  [facility] name and no written on white board, mom instructed that she didn't to pump breasts if baby fed well at breast, if baby  too sleepy to nurse or latch, pump breasts  Interventions Interventions: Breast feeding basics reviewed;Assisted with latch;Hand express;Support pillows;Education  Discharge    Consult Status Consult Status: Follow-up Date: 08/15/21 Follow-up type: In-patient    [name] 08/14/2021, 3:07 PM

## 2021-08-14 NOTE — TOC CM/SW Note (Signed)
Shelters  [facility] [employer]: [phone] (24 hours a day)    [employer] [facility] [employer] [phone]  [facility] [place] [phone]  [facility] [place] [phone]  [facility]  [phone]

## 2021-08-14 NOTE — Progress Notes (Signed)
Patient ID: Lauren Cunningham, female   DOB: Aug 09, 1995, 26 y.o.   MRN: [id number] Good diuresis and now off magnesium . BP are mildly elevated , less labile . Continue Labetalol and procardia . May have to adjust procardia upwards
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
## 2021-08-14 NOTE — Lactation Note (Signed)
This note was copied from a baby's chart. Lactation Consultation Note  Patient Name: Lauren Cunningham Date: 08/14/2021 Reason for consult: Follow-up assessment;Primapara;Early term 37-38.6wks Age:28 hours Mom in LDR 01, off MgSO4 at 0400  Maternal Data Has patient been taught Hand Expression?: Yes Does the patient have breastfeeding experience prior to this delivery?: No Mom has been pumping  breasts approx q 3h, gettting approx 1 cc colostrum each session Feeding Mother's Current Feeding Choice: Breast Milk and Formula Nipple Type: Slow - flow Attempted breastfeeding baby, mom's left nipple at baby's mouth, but baby makes no effort to suck , switched to right breast in football hold, no effort to root or latch on this side, baby will suck on gloved finger when moved on tongue but will not spontaneously suck without stimulation, T Su Hilt RN will attempt giving EBM per bottle  LATCH Score Latch: Too sleepy or reluctant, no latch achieved, no sucking elicited.  Audible Swallowing: None  Type of Nipple: Everted at rest and after stimulation  Comfort (Breast/Nipple): Soft / non-tender  Hold (Positioning): Full assist, staff holds infant at breast  LATCH Score: 4   Lactation Tools Discussed/Used  Pump breasts again at 3 hrs, will attempt at breast at next feeding when mom is moved to mother baby unit  Interventions Interventions: Breast feeding basics reviewed;Assisted with latch;Hand express;DEBP;Education  Discharge Pump: DEBP WIC Program: Yes  Consult Status Consult Status: Follow-up from L&D    [name] 08/14/2021, 11:26 AM

## 2021-08-14 NOTE — Progress Notes (Signed)
Pt complaining of pain in her abdomen. Medications given, patient turned on her right side with pillow support, K-pad placed over abdomen and patient encouraged to drink warm prune juice to alleviate symptoms.

## 2021-08-15 ENCOUNTER — Encounter: Payer: Self-pay | Admitting: Obstetrics and Gynecology

## 2021-08-15 LAB — COMPREHENSIVE METABOLIC PANEL
ALT: 14 U/L (ref 0–44)
AST: 26 U/L (ref 15–41)
Albumin: 1.6 g/dL — ABNORMAL LOW (ref 3.5–5.0)
Alkaline Phosphatase: 150 U/L — ABNORMAL HIGH (ref 38–126)
Anion gap: 5 (ref 5–15)
BUN: 7 mg/dL (ref 6–20)
CO2: 24 mmol/L (ref 22–32)
Calcium: 7.4 mg/dL — ABNORMAL LOW (ref 8.9–10.3)
Chloride: 109 mmol/L (ref 98–111)
Creatinine, Ser: 0.65 mg/dL (ref 0.44–1.00)
GFR, Estimated: >60 mL/min (ref >60)
Glucose, Bld: 86 mg/dL (ref 70–99)
Potassium: 3.8 mmol/L (ref 3.5–5.1)
Sodium: 138 mmol/L (ref 135–145)
Total Bilirubin: 0.3 mg/dL (ref 0.3–1.2)
Total Protein: 4.8 g/dL — ABNORMAL LOW (ref 6.5–8.1)

## 2021-08-15 LAB — URINALYSIS, COMPLETE (UACMP) WITH MICROSCOPIC
Bacteria, UA: NONE SEEN
Bilirubin Urine: NEGATIVE
Glucose, UA: NEGATIVE mg/dL
Hgb urine dipstick: NEGATIVE
Ketones, ur: NEGATIVE mg/dL
Leukocytes,Ua: NEGATIVE
Nitrite: NEGATIVE
Protein, ur: 100 mg/dL — AB
Specific Gravity, Urine: 1.005 (ref 1.005–1.030)
pH: 8 (ref 5.0–8.0)

## 2021-08-15 LAB — CBC
HCT: 29.8 % — ABNORMAL LOW (ref 36.0–46.0)
HCT: 31.3 % — ABNORMAL LOW (ref 36.0–46.0)
Hemoglobin: 9.6 g/dL — ABNORMAL LOW (ref 12.0–15.0)
Hemoglobin: 10.1 g/dL — ABNORMAL LOW (ref 12.0–15.0)
MCH: 27.5 pg (ref 26.0–34.0)
MCH: 27.7 pg (ref 26.0–34.0)
MCHC: 32.2 g/dL (ref 30.0–36.0)
MCHC: 32.3 g/dL (ref 30.0–36.0)
MCV: 85.4 fL (ref 80.0–100.0)
MCV: 86 fL (ref 80.0–100.0)
Platelets: 113 10*3/uL — ABNORMAL LOW (ref 150–400)
Platelets: 125 10*3/uL — ABNORMAL LOW (ref 150–400)
RBC: 3.49 MIL/uL — ABNORMAL LOW (ref 3.87–5.11)
RBC: 3.64 MIL/uL — ABNORMAL LOW (ref 3.87–5.11)
RDW: 17 % — ABNORMAL HIGH (ref 11.5–15.5)
RDW: 17 % — ABNORMAL HIGH (ref 11.5–15.5)
WBC: 23.8 10*3/uL — ABNORMAL HIGH (ref 4.0–10.5)
WBC: 27.2 10*3/uL — ABNORMAL HIGH (ref 4.0–10.5)
nRBC: 0.1 % (ref 0.0–0.2)
nRBC: 0.1 % (ref 0.0–0.2)

## 2021-08-15 MED ORDER — LABETALOL HCL 200 MG PO TABS
400.0000 mg | ORAL_TABLET | Freq: Two times a day (BID) | ORAL | Status: DC
  Administered 2021-08-15 – 2021-08-16 (×3): 400 mg via ORAL
  Filled 2021-08-15 (×3): qty 2

## 2021-08-15 MED ORDER — LABETALOL HCL 200 MG PO TABS
400.0000 mg | ORAL_TABLET | Freq: Three times a day (TID) | ORAL | Status: DC
Start: 2021-08-15 — End: 2021-08-15

## 2021-08-15 MED ORDER — LABETALOL HCL 200 MG PO TABS: 400.0000 mg | ORAL_TABLET | Freq: Three times a day (TID) | ORAL | Status: DC

## 2021-08-15 MED ORDER — LABETALOL HCL 200 MG PO TABS
400.0000 mg | ORAL_TABLET | Freq: Once | ORAL | Status: AC
  Administered 2021-08-15: 400 mg via ORAL
  Filled 2021-08-15: qty 2

## 2021-08-15 MED ORDER — FERROUS SULFATE 325 (65 FE) MG PO TABS
325.0000 mg | ORAL_TABLET | Freq: Two times a day (BID) | ORAL | Status: DC
  Administered 2021-08-15 – 2021-08-16 (×2): 325 mg via ORAL
  Filled 2021-08-15 (×2): qty 1

## 2021-08-15 MED ORDER — NIFEDIPINE ER OSMOTIC RELEASE 30 MG PO TB24
90.0000 mg | ORAL_TABLET | Freq: Every day | ORAL | Status: DC
  Administered 2021-08-15 – 2021-08-16 (×2): 90 mg via ORAL
  Filled 2021-08-15 (×2): qty 3

## 2021-08-15 NOTE — Progress Notes (Signed)
Per CNM Donato Schultz, will hold midnight tylenol dose and spot check pt temp around 2am. See new orders.

## 2021-08-15 NOTE — Progress Notes (Signed)
Pt alerted RN when she got up to urinate at 0315 she felt dizzy when standing. Resolved once pt got back in bed. 0530 pt up to urinate, called RN and stated she felt dizzy when ambulating. Pt had urinary urgency both times when she felt dizzy and was unsure if it was related to her standing up too fast. Vitals WNL. Orthostatics taken and were WNL. 9244 pt complaining of sudden onset HA posteriorly rating 5/10 dull and achy. Declines any other symptoms at this point. R. McVey notified via phone. Will wait on labs. Relayed to pt to remain in bed and call if she needs to use the bathroom. Pt verbalized understanding.

## 2021-08-15 NOTE — Progress Notes (Signed)
Post Partum Day 2 Subjective: Doing well.  Tolerating regular diet, pain with PO meds. Became dizzy when up to the bathroom around 0600 and states she almost fell off the toilet from the dizziness. Denies LOC. Dizziness resolved upon returning to bed. She has not attempted to get out of bed since and has been instructed to call for assistance prior to getting out of bed.  No CP SOB Fever,Chills, N/V or leg pain; denies nipple or breast pain, no HA change of vision, RUQ/epigastric pain  Objective: BP (!) 135/93 (BP Location: Right Arm)   Pulse (!) 120   Temp 98.9 F (37.2 C) (Oral)   Resp 16   Ht 5\' 3"  (1.6 m)   Wt 95 kg   LMP 11/24/2020 (Exact Date)   SpO2 97%   Breastfeeding Unknown   BMI 37.10 kg/m    Physical Exam:  General: NAD Breasts: soft/nontender CV: RRR Pulm: nl effort, CTABL Abdomen: soft, NT, BS x 4 Incision: Dsg CDI/no erythema or drainage Lochia: light Uterine Fundus: fundus firm and 1 fb below umbilicus DVT Evaluation: no cords, ttp LEs   Recent Labs    08/14/21 0637 08/15/21 0541  HGB 9.2* 9.6*  HCT 28.1* 29.8*  WBC 14.3* 23.8*  PLT 93* 113*   Vitals:   08/14/21 0832 08/14/21 1051 08/14/21 1052 08/14/21 1423  BP: 128/77 (!) 140/95 140/86 (!) 136/97   08/14/21 1603 08/14/21 1830 08/14/21 2024 08/14/21 2350  BP: (!) 153/106 (!) 142/99 (!) 147/108 119/85   08/15/21 0415 08/15/21 0550 08/15/21 0551 08/15/21 0552  BP: (!) 133/98 137/90 (!) 135/93 (!) 135/93     Assessment/Plan: 26 y.o. G1P1001 postpartum day # 2  - Continue routine PP care. She desires discharge home today but I discussed with her in depth that for safety I recommend she stay for monitoring one more day. We are adjusting her BP medications to see if that was the cause of the dizziness this morning. We also are monitoring for any signs of infection d/t her WBC count increasing from 14.3 -> 23.8. She has no current fever, body aches, redness, purulent drainage, foul-smelling discharge, or  other signs of infection. She still would like to be discharged today. I discussed with her that we can reevaluate how she is feeling this evening and discuss whether she may be appropriate for discharge this evening. Notified Dr. 34 and he agrees she should stay overnight, if she is agreeable. - Preeclampsia: labs are stable, platelets are improving. She denies headaches/changes of vision/RUQ pain. Blood pressures 130s/90s. Discussed with Dr. Jean Rosenthal and Dr. Jean Rosenthal and will adjust her BP medications to Labetalol 400mg  BID and Procardia 90mg  XL daily. - elevated WBC count: repeat CBC in the morning. I reviewed her WBC count with Dr. Feliberto Gottron. Will monitor for fevers or other s/s infection and test/treat as needed. - Lactation consult - Discussed contraceptive options including implant, IUDs hormonal and non-hormonal, injection, pills/ring/patch, condoms, and NFP.  - Acute blood loss anemia - hemodynamically stable and asymptomatic; start po ferrous sulfate BID with stool softeners  - Immunization status: all Imms up to date  Disposition: Does desire Dc home today. Will re-evaluate appropriateness for discharge this evening.  , CNM 08/15/2021 8:39 AM

## 2021-08-15 NOTE — Progress Notes (Signed)
S: She is feeling better, her dizziness is less. She states she had a headache earlier but it resolved after she voided and ate dinner. She denies feeling feverish, denies UTI symptoms or pain/burning with voiding, denies uterine tenderness except during fundal rubs.  O:    Latest Ref Rng & Units 08/15/2021    5:53 PM 08/15/2021    5:41 AM 08/14/2021    6:37 AM  CBC  WBC 4.0 - 10.5 K/uL 27.2  23.8  14.3   Hemoglobin 12.0 - 15.0 g/dL 02.5  9.6  9.2   Hematocrit 36.0 - 46.0 % 31.3  29.8  28.1   Platelets 150 - 400 K/uL 125  113  93    Vitals:   08/15/21 0912 08/15/21 1221 08/15/21 1624 08/15/21 1922  BP: 119/89 123/90 130/90 (!) 132/96  Pulse: 96 (!) 103 (!) 107 (!) 117  Temp: 98.4 F (36.9 C) 98.2 F (36.8 C)  98.2 F (36.8 C)  Resp: 17 18 17 18   Height:      Weight:      SpO2: 99% 95% 97% 99%  TempSrc:    Oral  BMI (Calculated):       Fundus: firm, @ umbilicus, scant drainage, tender to palpation but tenderness resolves after palpation Lochia: light Incision: dressing c/d/I, scant drainage present  A: 26 year old G1P1001 postpartum day #2 s/p magnesium for pre-eclampsia with severe features, now with elevated WBC count  P: - her WBC count increased from 23.8 -> 27.2. She remains afebrile. She denies any unusual pain, no abnormal uterine tenderness, no foul-smelling discharge, no purulent discharge or pain or red streaks in incision.  - UA and urine culture ordered - advised RN to withhold midnight scheduled Tylenol in case the scheduled Tylenol is suppressing a fever  - repeat CBC in the morning - if she develops a fever, uterine tenderness, severe fundal tenderness, or other signs of infection, will order blood cultures, CXR, and start antibiotics.  - I discussed her lab results and our concern of possible infection with her in depth. She denies any s/s infection and is agreeable to stay and be monitored over night.  - reviewed plan of care with Dr. 34 and he is agreeable,  advises to continue to monitor for signs of infection.  Disposition: does not desire d/c home today.  Jean Rosenthal, CNM 08/15/2021 8:28 PM

## 2021-08-16 LAB — CBC
HCT: 28.7 % — ABNORMAL LOW (ref 36.0–46.0)
Hemoglobin: 9.1 g/dL — ABNORMAL LOW (ref 12.0–15.0)
MCH: 27.5 pg (ref 26.0–34.0)
MCHC: 31.7 g/dL (ref 30.0–36.0)
MCV: 86.7 fL (ref 80.0–100.0)
Platelets: 135 10*3/uL — ABNORMAL LOW (ref 150–400)
RBC: 3.31 MIL/uL — ABNORMAL LOW (ref 3.87–5.11)
RDW: 17 % — ABNORMAL HIGH (ref 11.5–15.5)
WBC: 24.1 10*3/uL — ABNORMAL HIGH (ref 4.0–10.5)
nRBC: 0.1 % (ref 0.0–0.2)

## 2021-08-16 MED ORDER — LABETALOL HCL 200 MG PO TABS: 400.0000 mg | ORAL_TABLET | Freq: Two times a day (BID) | ORAL | 2 refills | Status: AC

## 2021-08-16 MED ORDER — ACETAMINOPHEN 500 MG PO TABS: 1000.0000 mg | ORAL_TABLET | Freq: Four times a day (QID) | ORAL | 0 refills | Status: AC | PRN

## 2021-08-16 MED ORDER — IBUPROFEN 800 MG PO TABS: 800.0000 mg | ORAL_TABLET | Freq: Three times a day (TID) | ORAL | 0 refills | Status: AC | PRN

## 2021-08-16 MED ORDER — OXYCODONE HCL 5 MG PO TABS: 5.0000 mg | ORAL_TABLET | ORAL | 0 refills | Status: AC | PRN

## 2021-08-16 MED ORDER — CHEWING GUM (ORBIT) SUGAR FREE
1.0000 | CHEWING_GUM | ORAL | Status: DC | PRN
  Filled 2021-08-16: qty 1

## 2021-08-16 MED ORDER — NIFEDIPINE ER OSMOTIC RELEASE 90 MG PO TB24: 90.0000 mg | ORAL_TABLET | Freq: Every day | ORAL | 3 refills | Status: AC

## 2021-08-16 MED ORDER — FERROUS SULFATE 325 (65 FE) MG PO TABS: 325.0000 mg | ORAL_TABLET | Freq: Two times a day (BID) | ORAL | 3 refills | Status: AC

## 2021-08-16 NOTE — Progress Notes (Signed)
Spoke with Donato Schultz, CNM for update on pt. Plan for tonight is to hold 6am tylenol. Check temps q2hr.

## 2021-08-16 NOTE — Progress Notes (Addendum)
Mother discharged.  Discharge instructions given.  Mother verbalizes understanding.  Placenta consent signed, placenta placed on ice and in cooler. Transported by auxiliary.

## 2021-08-16 NOTE — Discharge Instructions (Signed)

## 2021-08-16 NOTE — Progress Notes (Signed)
Postop Day  3  Subjective: no complaints, up ad lib, voiding, tolerating PO, and + flatus.  Reports increased gas pain but coping well with it.   Doing well, no concerns. Ambulating without difficulty, pain managed with PO meds, tolerating regular diet, and voiding without difficulty.   No fever/chills, chest pain, shortness of breath, nausea/vomiting, or leg pain. No nipple or breast pain. No headache, visual changes, or RUQ/epigastric pain.  Objective: BP 122/90 (BP Location: Right Arm)   Pulse (!) 117   Temp 98.6 F (37 C) (Oral)   Resp 18   Ht 5\' 3"  (1.6 m)   Wt 90.5 kg   LMP 11/24/2020 (Exact Date)   SpO2 99%   Breastfeeding Unknown   BMI 35.36 kg/m   Vitals:   08/15/21 0415 08/15/21 0550 08/15/21 0551 08/15/21 0552  BP: (!) 133/98 137/90 (!) 135/93 (!) 135/93   08/15/21 0912 08/15/21 1221 08/15/21 1624 08/15/21 1922  BP: 119/89 123/90 130/90 (!) 132/96   08/15/21 2208 08/15/21 2328 08/16/21 0423 08/16/21 0822  BP: 123/85 112/80 122/86 122/90      Physical Exam:  General: alert, cooperative, and no distress Breasts: soft/nontender CV: RRR Pulm: nl effort, CTABL Abdomen: soft, non-tender, active bowel sounds Uterine Fundus: firm Incision: no significant drainage, covered with occlusive OP Site dressing  Perineum: minimal edema, intact Lochia: appropriate DVT Evaluation: No evidence of DVT seen on physical exam.  Recent Labs    08/15/21 1753 08/16/21 0545  HGB 10.1* 9.1*  HCT 31.3* 28.7*  WBC 27.2* 24.1*  PLT 125* 135*    Assessment/Plan: 26 y.o. G1P1001 postpartum day # 3  -Continue routine postpartum care -Lactation consult PRN for breastfeeding  -Acute blood loss anemia - hemodynamically stable and asymptomatic; continue PO ferrous sulfate BID with stool softeners  -Acute leukocytosis - WBC improving and remains afebrile and asymptomatic.  -Blood pressures controlled with 90mg  Procardia and Labetalol 400mg  BID.   -Continue with simethicone for gas,  gum ordered.  Encouraged ambulation and instructed not to drink through straws until gas pain has improved.  -Immunization status:   all immunizations up to date -Discussed labs and assessment with Dr. 10/17/21.  Will plan to DC today with close follow up in office outpatient.    Disposition: Desires discharge home today   LOS: 4 days   , 08/16/2021, 10:33 AM   ----- Gustavo Lah  Certified Nurse Midwife [facility]

## 2021-08-17 LAB — URINE CULTURE: Special Requests: NORMAL

## 2021-08-23 ENCOUNTER — Emergency Department
Admission: EM | Admit: 2021-08-23 | Discharge: 2021-08-23 | Disposition: A | Discharge status: Home / Self Care | Payer: PRIVATE HEALTH INSURANCE | Source: Ambulatory Visit | Attending: Emergency Medicine | Admitting: Emergency Medicine

## 2021-08-23 ENCOUNTER — Other Ambulatory Visit: Payer: Self-pay

## 2021-08-23 ENCOUNTER — Encounter: Payer: Self-pay | Admitting: Emergency Medicine

## 2021-08-23 DIAGNOSIS — Z3A12 12 weeks gestation of pregnancy: Secondary | ICD-10-CM

## 2021-08-23 DIAGNOSIS — O99891 Other specified diseases and conditions complicating pregnancy: Secondary | ICD-10-CM

## 2021-08-23 DIAGNOSIS — R519 Headache, unspecified: Secondary | ICD-10-CM | POA: Insufficient documentation

## 2021-08-23 DIAGNOSIS — H538 Other visual disturbances: Secondary | ICD-10-CM

## 2021-08-23 LAB — POCT URINALYSIS DIPSTICK
Glucose,UA POCT: NORMAL mg/dL
Ketones,UA POCT: NEGATIVE mg/dL
Leuk Esterase,UA POCT: NEGATIVE
Lot #: 64074302
Nitrite,UA POCT: NEGATIVE — NL
PH,UA POCT: 5 (ref 5–8)

## 2021-08-23 LAB — BASIC METABOLIC PANEL
Anion Gap: 14 (ref 7–16)
CO2: 19 mmol/L — ABNORMAL LOW (ref 20–28)
Calcium: 9.4 mg/dL (ref 8.8–10.2)
Chloride: 103 mmol/L (ref 96–108)
Creatinine: 0.51 mg/dL (ref 0.51–0.95)
Glucose: 92 mg/dL (ref 60–99)
Lab: 13 mg/dL (ref 6–20)
Potassium: 4.1 mmol/L (ref 3.3–5.1)
Sodium: 136 mmol/L (ref 133–145)
eGFR BY CREAT: 130 *

## 2021-08-23 LAB — CBC AND DIFFERENTIAL
Baso # K/uL: 0.1 10*3/uL (ref 0.0–0.1)
Basophil %: 0.4 %
Eos # K/uL: 0.2 10*3/uL (ref 0.0–0.4)
Eosinophil %: 1.6 %
Hematocrit: 39 % (ref 34–45)
Hemoglobin: 13.3 g/dL (ref 11.2–15.7)
IMM Granulocytes #: 0 10*3/uL (ref 0.0–0.0)
IMM Granulocytes: 0.3 %
Lymph # K/uL: 3.8 10*3/uL — ABNORMAL HIGH (ref 1.2–3.7)
Lymphocyte %: 31.1 %
MCH: 31 pg (ref 26–32)
MCHC: 34 g/dL (ref 32–36)
MCV: 92 fL (ref 79–95)
Mono # K/uL: 0.8 10*3/uL (ref 0.2–0.9)
Monocyte %: 6.2 %
Neut # K/uL: 7.3 10*3/uL — ABNORMAL HIGH (ref 1.6–6.1)
Nucl RBC # K/uL: 0 10*3/uL (ref 0.0–0.0)
Nucl RBC %: 0 /100 WBC (ref 0.0–0.2)
Platelets: 313 10*3/uL (ref 160–370)
RBC: 4.3 MIL/uL (ref 3.9–5.2)
RDW: 12.9 % (ref 11.7–14.4)
Seg Neut %: 60.4 %
WBC: 12.1 10*3/uL — ABNORMAL HIGH (ref 4.0–10.0)

## 2021-08-23 LAB — BHCG, QUANT PREGNANCY: BHCG, QUANT PREGNANCY: 69248 m[IU]/mL — ABNORMAL HIGH (ref 0–1)

## 2021-08-23 MED ORDER — DEXTROSE 5 % FLUSH FOR PUMPS *I*
0.0000 mL/h | INTRAVENOUS | Status: DC | PRN
Start: 2021-08-23 — End: 2021-08-24

## 2021-08-23 MED ORDER — METOCLOPRAMIDE HCL 5 MG/ML IJ SOLN *I*
10.0000 mg | Freq: Once | INTRAMUSCULAR | Status: AC
Start: 2021-08-23 — End: 2021-08-23
  Administered 2021-08-23: 10 mg via INTRAVENOUS
  Filled 2021-08-23: qty 2

## 2021-08-23 MED ORDER — SODIUM CHLORIDE 0.9 % FLUSH FOR PUMPS *I*
0.0000 mL/h | INTRAVENOUS | Status: DC | PRN
Start: 2021-08-23 — End: 2021-08-24

## 2021-08-23 MED ORDER — SODIUM CHLORIDE 0.9 % IV BOLUS *I*
1000.0000 mL | Freq: Once | Status: AC
Start: 2021-08-23 — End: 2021-08-23
  Administered 2021-08-23: 1000 mL via INTRAVENOUS

## 2021-08-23 MED ORDER — DIPHENHYDRAMINE HCL 50 MG/ML IJ SOLN *I*
12.5000 mg | Freq: Once | INTRAMUSCULAR | Status: AC
Start: 2021-08-23 — End: 2021-08-23
  Administered 2021-08-23: 12.5 mg via INTRAVENOUS

## 2021-08-23 MED ORDER — ACETAMINOPHEN 500 MG PO TABS *I*
1000.0000 mg | ORAL_TABLET | Freq: Once | ORAL | Status: AC
Start: 2021-08-23 — End: 2021-08-23
  Administered 2021-08-23: 1000 mg via ORAL

## 2021-08-23 NOTE — Discharge Instructions (Signed)
Today you are seen in the ED for evaluation of persistent headaches.  You state you are feeling better after medication administration so you can continue taking up to 3 g Tylenol a day or Benadryl as directed.  I provided referral to neurology as well as primary care to help with follow-up and management of your symptoms.  If you notice worsened headaches, visual disturbances, or weakness do not hesitate to return to the ED for further care.  The remainder of your laboratory work-up was reassuring with no evidence of electrolyte abnormalities.  There was a slight elevation in white blood cell count, however I have lower suspicion for an infectious process today.  At this point, you are stable for discharge and hope you feel better.

## 2021-08-23 NOTE — ED Triage Notes (Signed)
Pt has had persistent headaches every day for 2 months, pt denies head trauma, denies history of neuro issues. Pt. States when she has headaches she has vision changes, such as floaters and light auras.     Prehospital medications given: No

## 2021-08-23 NOTE — ED Provider Notes (Signed)
History     Chief Complaint   Patient presents with   . Headache     Patient is a 26 year old female who is G1P0000 with no significant documented past medical history who presents to the ED for evaluation of recurrent and persistent headache that has been present for the past 2 to 3 months intermittently.  She is currently approximately [redacted] weeks pregnant.  She denies triggers but does note frontal sensation with right-sided sharp pain intermittently.  She denies infectious symptoms such as fever and chills as well as nausea and vomiting.  Denies head trauma recently.  She states that the headaches began before her pregnancy but denies previous history of migraines or following with a neurologist in the past.  She denies sensory disturbances or weakness in her extremities.  She does note halo type visions in her eyes when these episodes occur and notes blurry vision which she states is worse when talking.  She states that she has been taking Tylenol with minimal relief at home prompting her to seek evaluation in the ED today.  She is new in the area and has not established with a primary care provider.  Denies abnormal vaginal bleeding or abdominal pain.              Medical/Surgical/Family History     No past medical history on file.     There is no problem list on file for this patient.           No past surgical history on file.  No family history on file.          Living Situation     Questions Responses    Patient lives with     Homeless     Caregiver for other family member     External Services     Employment     Domestic Violence Risk                 Review of Systems   Review of Systems   Constitutional: Negative for activity change, appetite change and fever.   HENT: Negative for congestion and sore throat.    Eyes: Positive for visual disturbance. Negative for pain.   Respiratory: Negative for chest tightness and shortness of breath.    Cardiovascular: Negative for chest pain.   Gastrointestinal:  Negative for abdominal pain, nausea and vomiting.   Genitourinary: Negative for dysuria and flank pain.   Musculoskeletal: Negative for arthralgias and myalgias.   Skin: Negative for color change.   Neurological: Positive for headaches. Negative for weakness, light-headedness and numbness.   Psychiatric/Behavioral: Negative for agitation and confusion.   All other systems reviewed and are negative.      Physical Exam     Triage Vitals  Triage Start: Start, (08/23/21 1800)  First Recorded BP: 121/70, Resp: 18, Temp: 35.6 C (96.1 F), Temp src: TEMPORAL Oxygen Therapy SpO2: 99 %, Heart Rate: 79, (08/23/21 1804) Heart Rate (via Pulse Ox): 79, (08/23/21 1804).      Physical Exam  Vitals and nursing note reviewed.   Constitutional:       Appearance: Normal appearance.      Comments: Patient is sitting comfortably in her chair in no acute distress   HENT:      Head: Normocephalic and atraumatic.      Right Ear: External ear normal.      Left Ear: External ear normal.      Nose: Nose normal.  Mouth/Throat:      Mouth: Mucous membranes are moist.   Eyes:      Extraocular Movements: Extraocular movements intact.      Comments: EOMI, PERRLA,   Neck:      Comments: Full range of motion of patient's neck with no signs of stiffness or rigidity  Cardiovascular:      Rate and Rhythm: Normal rate and regular rhythm.      Pulses: Normal pulses.   Pulmonary:      Effort: Pulmonary effort is normal.      Breath sounds: No wheezing, rhonchi or rales.   Abdominal:      General: Abdomen is flat.      Comments: Abdomen is diffusely nontender to palpation   Musculoskeletal:         General: Normal range of motion.      Cervical back: Normal range of motion.   Skin:     General: Skin is warm.   Neurological:      Mental Status: She is alert and oriented to person, place, and time.      Comments: ANO x3, cranial nerves II through XII intact, no signs of dysarthria or dysmetria.  Visual fields intact.  Able to ambulate in the ED without  gait deviation or disturbance   Psychiatric:         Mood and Affect: Mood normal.         Behavior: Behavior normal.       Expanded Neurologic Exam    Cranial Nerves:   II: PERRL, visual field exam: Intact all fields  III/IV/VI: EOM intact. No nystagmus. Eyelids open equal.  V: Facial sensation symmetric to light touch   VII: No Facial droop   VIII: Hearing intact bilaterally  IX/X: Normal swallowing, normal voice  XI: Equal shoulder shrug  XII: Tongue midline    AOx3  Speech: Normal  Strength intact x 4  Sensation intact x 4  Pronator drift was absent  Coordination: Finger to nose intact   Gait: Intact with no deviation or disturbance  Truncal Stability: able to sit upright                         Medical Decision Making     Assessment:  26 year old female who is G1P0000 who presents to the ED for evaluation of recurrent and persistent headache.  She states that this is been present now for 2 to 3 months with no associated triggers.  She states that they began before her pregnancy but become more persistent and painful within the past few days.  She notes occasional halos in her vision and blurry vision especially when she is staring at something for long periods of time.  She denies trauma to her head or previous documented history of migraines.  She denies fever or other systemic symptoms.  On exam she is developing no focal neurologic deficits and she is afebrile.  Abdomen nontender to palpation.    Differential diagnosis:  Migraine  Cluster type headache  Electrolyte abnormality  Meningitis less likely due to no evidence of clinical manifestations of infection  Intracranial bleed less likely due to negative risk factors  Pseudotumor cerebri  Cavernous venous thrombosis less likely    Plan:  Orders Placed This Encounter      CBC and differential      Basic metabolic panel      BHCG, quant pregnancy      ED/UC REFERRAL TO NEURO  ED/UC REFERRAL TO PRIMARY CARE      POCT urinalysis dipstick      Insert  peripheral IV    Medications  sodium chloride 0.9 % FLUSH REQUIRED IF PATIENT HAS IV (has no administration in time range)  dextrose 5 % FLUSH REQUIRED IF PATIENT HAS IV (has no administration in time range)  diphenhydrAMINE (BENADRYL) 50 mg/mL injection 12.5 mg (12.5 mg Intravenous Given 08/23/21 2007)  acetaminophen (TYLENOL) tablet 1,000 mg (1,000 mg Oral Given 08/23/21 2007)  sodium chloride 0.9 % bolus 1,000 mL (0 mLs Intravenous Stopped 08/23/21 2108)  metoclopramide (REGLAN) injection 10 mg (10 mg Intravenous Given 08/23/21 2006)      Review of existing & external labs / records: I reviewed patient's past medical history and records    Independent interpretation of imaging: No imaging warranted    ED Course and Disposition:  Laboratory prep reassuring.  Beta hCG elevated to 69,000 which is in line with her estimated timeline of pregnancy.  She does have a mild leukocytosis however there are no infectious symptoms, photophobia, or nuchal rigidity making me less concerned about meningitis.  Electrolytes are stable and there is no signs of overt proteinuria on urinalysis.  I provided neurology referral as her symptoms have improved after pharmaceutical intervention for evaluation of recurrent headaches and have also provided primary care referral as she is new in the area and has not established care anywhere.  She does currently have an OB/GYN and I advised her to seek follow-up.  At this time, she is stable for discharge with no further care in the ED.      Labs Reviewed   CBC AND DIFFERENTIAL - Abnormal; Notable for the following components:       Result Value    WBC 12.1 (*)     Neut # K/uL 7.3 (*)     Lymph # K/uL 3.8 (*)     All other components within normal limits   BASIC METABOLIC PANEL - Abnormal; Notable for the following components:    CO2 19 (*)     All other components within normal limits   BHCG, QUANT PREGNANCY - Abnormal; Notable for the following components:    BHCG, QUANT PREGNANCY 69,248 (*)      All other components within normal limits   POCT URINALYSIS DIPSTICK - Abnormal; Notable for the following components:    Protein,UA POCT Trace (*)     Blood,UA POCT About 250 (*)     All other components within normal limits     No orders to display     This note was transcribed using Medical illustrator.  A reasonable effort was made to fix any spelling or grammatical mistakes made with the Dragon electronic dictating system but there may be minor typographical errors remaining.             Leandro Reasoner, PA             [place], [place], [place]  08/23/21 2214

## 2021-08-24 ENCOUNTER — Telehealth: Payer: Self-pay

## 2021-08-24 NOTE — Telephone Encounter (Signed)
Called and spoke to patient- patient stated she was at work and will call us on her lunch break

## 2021-08-25 NOTE — Telephone Encounter (Signed)
No answer

## 2021-09-02 ENCOUNTER — Other Ambulatory Visit
Admission: RE | Admit: 2021-09-02 | Discharge: 2021-09-02 | Disposition: A | Discharge status: Home / Self Care | Payer: PRIVATE HEALTH INSURANCE | Source: Ambulatory Visit | Attending: Obstetrics and Gynecology | Admitting: Obstetrics and Gynecology

## 2021-09-02 ENCOUNTER — Other Ambulatory Visit
Admission: RE | Admit: 2021-09-02 | Discharge: 2021-09-02 | Disposition: A | Discharge status: Home / Self Care | Payer: PRIVATE HEALTH INSURANCE | Source: Ambulatory Visit

## 2021-09-02 DIAGNOSIS — Z3481 Encounter for supervision of other normal pregnancy, first trimester: Secondary | ICD-10-CM | POA: Insufficient documentation

## 2021-09-02 DIAGNOSIS — R87820 Cervical low risk human papillomavirus (HPV) DNA test positive: Secondary | ICD-10-CM | POA: Insufficient documentation

## 2021-09-02 DIAGNOSIS — Z3401 Encounter for supervision of normal first pregnancy, first trimester: Secondary | ICD-10-CM | POA: Insufficient documentation

## 2021-09-03 LAB — N. GONORRHOEAE NAAT (PCR): N. gonorrhoeae NAAT (PCR): NEGATIVE

## 2021-09-03 LAB — TRICHOMONAS NAAT (PCR): Trichomonas NAAT (PCR): NEGATIVE

## 2021-09-03 LAB — CHLAMYDIA NAAT (PCR): Chlamydia NAAT (PCR): NEGATIVE

## 2021-09-03 LAB — AEROBIC CULTURE: Aerobic Culture: 0

## 2021-09-07 LAB — MATERNAL SERUM SCREEN, FIRST TRIMESTER
Crown Rump Length: 57 mm
Gestational Age Calculated at Collection: 12
Maternal Age At Delivery: 28.6 yr
Maternal Screen Interpretation: NEGATIVE
Maternal Weight: 172
MoM for NT: 0.84
MoM for PAPP-A: 0.52
Nuchal Translucency (NT): 1.2 mm
PAPP-A Maternal: 336.3 ng/mL
Patient's hCG, 1st Trimester: 66699 IU/L
hCG MoM, 1st Trimester: 0.88

## 2021-09-08 LAB — GYN CYTOLOGY

## 2021-12-10 ENCOUNTER — Encounter: Payer: Self-pay | Admitting: Emergency Medicine

## 2021-12-10 ENCOUNTER — Other Ambulatory Visit: Payer: Self-pay

## 2021-12-10 ENCOUNTER — Emergency Department
Admission: EM | Admit: 2021-12-10 | Discharge: 2021-12-10 | Disposition: A | Discharge status: Home / Self Care | Payer: Medicaid Other | Attending: Emergency Medicine | Admitting: Emergency Medicine

## 2021-12-10 DIAGNOSIS — L600 Ingrowing nail: Secondary | ICD-10-CM | POA: Diagnosis not present

## 2021-12-10 DIAGNOSIS — L03032 Cellulitis of left toe: Secondary | ICD-10-CM | POA: Insufficient documentation

## 2021-12-10 DIAGNOSIS — M79676 Pain in unspecified toe(s): Secondary | ICD-10-CM | POA: Diagnosis present

## 2021-12-10 DIAGNOSIS — L03039 Cellulitis of unspecified toe: Secondary | ICD-10-CM

## 2021-12-10 MED ORDER — CEPHALEXIN 500 MG PO CAPS: 1000.0000 mg | ORAL_CAPSULE | Freq: Two times a day (BID) | ORAL | 0 refills | Status: AC

## 2021-12-10 NOTE — ED Triage Notes (Signed)
Pt reports ingrown toe nail to left great toe.

## 2021-12-10 NOTE — ED Provider Notes (Signed)
[facility] Provider Note  Patient Contact: 6:17 PM (approximate)   History   Toe Pain   HPI  Lauren Cunningham is a 26 y.o. female who presents the emergency department complaining of a "hangnail" possible infection.  Patient states that she has had symptoms off and on for several weeks.  No gross swelling of the digit.  No other injury or complaint.     Physical Exam   Triage Vital Signs: ED Triage Vitals  Enc Vitals Group     BP 12/10/21 1632 114/63     Pulse Rate 12/10/21 1632 75     Resp 12/10/21 1632 16     Temp 12/10/21 1632 98.4 F (36.9 C)     Temp Source 12/10/21 1632 Oral     SpO2 12/10/21 1632 100 %     Weight 12/10/21 1632 180 lb (81.6 kg)     Height 12/10/21 1632 5\' 4"  (1.626 m)     Head Circumference --      Peak Flow --      Pain Score 12/10/21 1636 6     Pain Loc --      Pain Edu? --      Excl. in [place]? --     Most recent vital signs: Vitals:   12/10/21 1632 12/10/21 1636  BP: 114/63 114/63  Pulse: 75 78  Resp: 16 16  Temp: 98.4 F (36.9 C) 98.4 F (36.9 C)  SpO2: 100% 98%     General: Alert and in no acute distress.  Cardiovascular:  Good peripheral perfusion Respiratory: Normal respiratory effort without tachypnea or retractions. Lungs CTAB.  Musculoskeletal: Full range of motion to all extremities.  Position of the great toe of the left foot reveals slight erythema along the medial border of the toe.  This is tender to palpation with no fluctuance.  No evidence of felon's.  No infectious signs of infectious tenosynovitis.  Suspect ingrown toenail with mild cellulitis/paronychia Neurologic:  No gross focal neurologic deficits are appreciated.  Skin:   No rash noted Other:   ED Results / Procedures / Treatments   Labs (all labs ordered are listed, but only abnormal results are displayed) Labs Reviewed - No data to display   EKG     RADIOLOGY    No results found.  PROCEDURES:  Critical Care  performed: No  Procedures   MEDICATIONS ORDERED IN ED: Medications - No data to display   IMPRESSION / MDM / [place] / ED COURSE  I reviewed the triage vital signs and the nursing notes.                              Differential diagnosis includes, but is not limited to, paronychia, ingrown nail, fracture, felon, infectious tenosynovitis  Patient's presentation is most consistent with acute presentation with potential threat to life or bodily function.   Patient's diagnosis is consistent with paronychia of the toe with ingrown toenail.  Patient presents to the emergency department complaining of pain, edema and erythema along the medial nail bed.  Patient has findings consistent with mild cellulitic changes with likely ingrown toenail.  Instructions for care for ingrown toenail are discussed.  Antibiotics prescribed at this time.  No indication for drainage as there is no fluctuant area concerning for fluid-filled lesion.  Return precautions discussed with the patient.  Follow-up primary care as needed.. Patient is given ED precautions to return to  the ED for any worsening or new symptoms.        FINAL CLINICAL IMPRESSION(S) / ED DIAGNOSES   Final diagnoses:  Ingrown nail of great toe  Paronychia of great toe     Rx / DC Orders   ED Discharge Orders          Ordered    cephALEXin (KEFLEX) 500 MG capsule  2 times daily        12/10/21 1830             Note:  This document was prepared using Dragon voice recognition software and may include unintentional dictation errors.   Lanette Hampshire 12/10/21 1831    Georga Hacking, MD 12/10/21 [phone]

## 2021-12-26 ENCOUNTER — Other Ambulatory Visit
Admission: RE | Admit: 2021-12-26 | Discharge: 2021-12-26 | Disposition: A | Discharge status: Home / Self Care | Payer: PRIVATE HEALTH INSURANCE | Source: Ambulatory Visit | Attending: Obstetrics | Admitting: Obstetrics

## 2021-12-26 DIAGNOSIS — Z3401 Encounter for supervision of normal first pregnancy, first trimester: Secondary | ICD-10-CM | POA: Insufficient documentation

## 2021-12-26 DIAGNOSIS — Z3402 Encounter for supervision of normal first pregnancy, second trimester: Secondary | ICD-10-CM | POA: Insufficient documentation

## 2021-12-26 LAB — CBC AND DIFFERENTIAL
Baso # K/uL: 0 10*3/uL (ref 0.0–0.2)
Basophil %: 0.3 %
Eos # K/uL: 0.2 10*3/uL (ref 0.0–0.5)
Eosinophil %: 1.2 %
Hematocrit: 34 % (ref 34–49)
Hemoglobin: 11.3 g/dL (ref 11.2–16.0)
IMM Granulocytes #: 0.1 10*3/uL — ABNORMAL HIGH (ref 0.0–0.0)
IMM Granulocytes: 0.9 %
Lymph # K/uL: 2 10*3/uL (ref 1.0–5.0)
Lymphocyte %: 16.2 %
MCH: 31 pg (ref 26–32)
MCHC: 34 g/dL (ref 32–36)
MCV: 92 fL (ref 75–100)
Mono # K/uL: 0.6 10*3/uL (ref 0.1–1.0)
Monocyte %: 4.9 %
Neut # K/uL: 9.6 10*3/uL — ABNORMAL HIGH (ref 1.5–6.5)
Nucl RBC # K/uL: 0 10*3/uL (ref 0.0–0.0)
Nucl RBC %: 0 /100 WBC (ref 0.0–0.2)
Platelets: 349 10*3/uL (ref 150–450)
RBC: 3.7 MIL/uL — ABNORMAL LOW (ref 4.0–5.5)
RDW: 13.2 % (ref 0.0–15.0)
Seg Neut %: 76.5 %
WBC: 12.6 10*3/uL — ABNORMAL HIGH (ref 3.5–11.0)

## 2021-12-26 LAB — GLUCOSE TOLERANCE, 1 HOUR: Glucose,50gm 1HR: 181 mg/dL — ABNORMAL HIGH (ref 63–135)

## 2021-12-26 LAB — GTT COLLECT INFO

## 2021-12-26 LAB — MCHC: MCHC: 34 g/dL (ref 32–36)

## 2021-12-26 LAB — HEMATOCRIT: Hematocrit: 34 % (ref 34–49)

## 2021-12-27 ENCOUNTER — Other Ambulatory Visit: Payer: Self-pay | Admitting: Gastroenterology

## 2021-12-27 LAB — HIV-1/2  ANTIGEN/ANTIBODY SCREEN WITH CONFIRMATION: HIV 1&2 ANTIGEN/ANTIBODY: NONREACTIVE

## 2021-12-27 LAB — TYPE AND SCREEN
ABO RH Blood Type: O POS
Antibody Screen: NEGATIVE

## 2021-12-27 LAB — SYPHILIS SCREEN
Syphilis Screen: NEGATIVE
Syphilis Status: NONREACTIVE

## 2021-12-27 LAB — LEAD, BLOOD

## 2021-12-27 LAB — RUBELLA ANTIBODY, IGG: Rubella IgG AB: POSITIVE

## 2021-12-27 LAB — HEPATITIS B SURFACE ANTIGEN: HBV S Ag: NEGATIVE

## 2021-12-27 LAB — COOMBS TEST, INDIRECT, QUALITATIVE: Antibody Screen: NEGATIVE

## 2021-12-27 LAB — HEPATITIS C ANTIBODY: Hep C Ab: NEGATIVE

## 2021-12-27 LAB — LEAD VENOUS: Lead,Venous: <1 ug/dl (ref 0.0–4.9)

## 2021-12-28 LAB — HEMOGLOBIN ELECTROPHORESIS
Hgb A1: 97.6 % (ref 96.8–97.8)
Hgb A2: 2.4 % (ref 2.2–3.2)
Interp,HBE: NORMAL

## 2021-12-28 LAB — HGB ELECT,REVIEW

## 2021-12-31 LAB — SPINAL MUSCULAR ATROPHY COPY NUMBER ANALYSIS
SMA Copy Number, SMN1 Copies: 2
SMA Copy Number, SMN2 Copies: 2

## 2022-01-03 LAB — CYSTIC FIBROSIS EXPANDED VARIANT PANEL
CF Expanded Variant Panel Interp: 0
Cystic Fibrosis, Allele 1: NEGATIVE
Cystic Fibrosis, Allele 2: NEGATIVE

## 2022-01-26 ENCOUNTER — Ambulatory Visit: Payer: Medicaid Other

## 2022-02-10 ENCOUNTER — Other Ambulatory Visit: Payer: Self-pay

## 2022-02-10 ENCOUNTER — Observation Stay
Admission: AD | Admit: 2022-02-10 | Discharge: 2022-02-10 | Disposition: A | Discharge status: Home / Self Care | Payer: PRIVATE HEALTH INSURANCE | Source: Ambulatory Visit | Attending: Obstetrics | Admitting: Obstetrics

## 2022-02-10 DIAGNOSIS — Z3A35 35 weeks gestation of pregnancy: Secondary | ICD-10-CM | POA: Insufficient documentation

## 2022-02-10 DIAGNOSIS — R42 Dizziness and giddiness: Secondary | ICD-10-CM

## 2022-02-10 DIAGNOSIS — Z3490 Encounter for supervision of normal pregnancy, unspecified, unspecified trimester: Secondary | ICD-10-CM

## 2022-02-10 DIAGNOSIS — O26893 Other specified pregnancy related conditions, third trimester: Secondary | ICD-10-CM

## 2022-02-10 DIAGNOSIS — O99891 Other specified diseases and conditions complicating pregnancy: Principal | ICD-10-CM | POA: Insufficient documentation

## 2022-02-10 LAB — CBC
Hematocrit: 34 % (ref 34–49)
Hemoglobin: 11.2 g/dL (ref 11.2–16.0)
MCH: 29 pg (ref 26–32)
MCHC: 33 g/dL (ref 32–36)
MCV: 89 fL (ref 75–100)
Platelets: 317 10*3/uL (ref 150–450)
RBC: 3.8 MIL/uL — ABNORMAL LOW (ref 4.0–5.5)
RDW: 13.1 % (ref 0.0–15.0)
WBC: 10.1 10*3/uL (ref 3.5–11.0)

## 2022-02-10 LAB — COMPREHENSIVE METABOLIC PANEL
ALT: 11 U/L (ref 0–35)
AST: 15 U/L (ref 0–35)
Albumin: 3.6 g/dL (ref 3.5–5.2)
Alk Phos: 103 U/L (ref 35–105)
Anion Gap: 11 (ref 7–16)
Bilirubin,Total: <0.2 mg/dL (ref 0.0–1.2)
CO2: 19 mmol/L — ABNORMAL LOW (ref 20–28)
Calcium: 9.1 mg/dL (ref 8.8–10.2)
Chloride: 106 mmol/L (ref 96–108)
Creatinine: 0.47 mg/dL — ABNORMAL LOW (ref 0.51–0.95)
Glucose: 99 mg/dL (ref 60–99)
Lab: 8 mg/dL (ref 6–20)
Potassium: 4.2 mmol/L (ref 3.3–5.1)
Sodium: 136 mmol/L (ref 133–145)
Total Protein: 6.5 g/dL (ref 6.3–7.7)
eGFR BY CREAT: 133 *

## 2022-02-10 LAB — PROTEIN,UR + CREAT,UR WITH RATIO
Creatinine,UR: 125 mg/dL (ref 20–300)
Protein,UR: 17 mg/dL — ABNORMAL HIGH (ref 0–11)
TP Creatinine ratio,UR: 0.14

## 2022-02-10 NOTE — OB Triage Note (Signed)
Pt presents to triage reporting dizziness/lightheadedness earlier today, seeing spots in her vision, has had a headache since 12/24 that resolves briefly with tylenol. Denies RUQ pain. Denies contractions, LOF, VB. Endorses fetal movement. VSS. Placed on fetal monitors. Will notify resident team.

## 2022-02-10 NOTE — Procedures (Deleted)
Fetal Non-Stress Test    Patient: Anita Patterson Age: 26 y.o.  Date of Birth: 1995-05-23  [id number]  eMRN: [id number]  GA: [redacted]w[redacted]d  Maternal  Heart Rate: 91 (02/10/22 1200)  BP: 111/71 (02/10/22 1200)    Reason For Exam: Pre E workup  Education Done:  External fetal monitoring    Fetal non-stress test for singleton pregnancy  Pre Procedure Diagnosis: Gestational hypertension, third trimester  Post Procedure Diagnosis: NST (non-stress test) reactive            NST Start Time:  02/10/2022 11:01 AM            Uterine Irritability: No            Contractions: rare    NST Fetus A  Variability: moderate  FHR Baseline: 125  Decelerations: none   Accelerations: at least 15 BPM for 15 seconds  Stimulation: none  NST Interpretation: reactive    End Time:  02/10/2022 11:23 AM          Duration of test (min): 22  Location of NST Fetal Heart Tracing: Archived electronically      Test performed By: Gillis Ends, RN  02/10/2022 12:06 PM

## 2022-02-10 NOTE — Progress Notes (Signed)
Pt discharged ambulatory from triage with discharge paperwork in hand per Dr. Doeblin.  All pt questions were answered at this time.   Kalis Friese RN

## 2022-02-10 NOTE — Procedures (Addendum)
Fetal Non-Stress Test    Patient: Shritha Heideman Age: 26 y.o.  Date of Birth: 11/30/1995  [id number]  eMRN: [id number]  GA: [redacted]w[redacted]d  Maternal  Heart Rate: 83 (02/10/22 1100)  BP: 116/74 (02/10/22 1100)    Reason For Exam: Pre-eclampsia workup  Education Done:  External fetal monitoring NST    Fetal non-stress test for singleton pregnancy  Pre Procedure Diagnosis: Dizziness  Post Procedure Diagnosis: NST (non-stress test) reactive            NST Start Time:  02/10/2022 11:02 AM            Uterine Irritability: Yes            Contractions: none    NST Fetus A  Variability: moderate  FHR Baseline: 125  Decelerations: none   Accelerations: at least 15 BPM for 15 seconds  Stimulation: none  NST Interpretation: reactive    End Time:  02/10/2022 11:22 AM          Duration of test (min): 20  Location of NST Fetal Heart Tracing: Archived electronically  Reviewed with:  Barton Dubois MD      Test performed By: Candida Peeling, RN  02/10/2022 11:25 AM

## 2022-02-10 NOTE — Discharge Instructions (Signed)
[facility]   OB Triage Discharge Instructions    You were seen in triage for:  dizziness    Diagnosis:   Encounter Diagnosis   Name Primary?    Pregnancy        You are being discharged to: Home  The following tests are still pending: None    You may resume your normal activity and limit sugar and carbohydrates in your diet.    You should follow up with your primary obstetric provider.       Call your primary obstetric provider or return to triage if you experience the following:  1. Worsening of your symptoms  2. Vaginal bleeding  3. Leakage of fluids from your vagina  4. Severe headache, chest pain, difficulty breathing  5. Severe abdominal pain, especially if on the right side  6. Baby moving less or not at all    If you cannot reach your obstetric provider, call 911 if it is an emergency.

## 2022-02-10 NOTE — OB Triage Note (Cosign Needed)
OBSTETRICS TRIAGE NOTE      Chief Complaint: Dizziness and Intermittent HA    HPI     Anita Patterson is a 26 y.o. G1P0 at [redacted]w[redacted]d  with pregnancy complicated by risks outlined below who presents with episode of dizziness at work. She reports that today, while at work she experienced an episode of dizziness associated with position change. She denies chest pain, shortness or breath, or RUQ pain.     She denies contractions, vaginal bleeding, LOF, and endorses normal FM      Pregnancy Risks     Elevated 1 hr GTT  - recommeded 3 hr by Attending    Tobacco use in Pregnancy    Limited PNC      Obstetrical History     OB History   Gravida Para Term Preterm AB Living   1             SAB IAB Ectopic Multiple Live Births                  # Outcome Date GA Lbr Len/2nd Weight Sex Delivery Anes PTL Lv   1 Current                  PMH, PSH, SH, GYN History, Allergies, and Current Medications reviewed and updated in eRecord.       Prenatal Labs     Recent Labs   Lab 02/10/22  1123   WBC 10.1   Hemoglobin 11.2   Hematocrit 34   Platelets 317     Recent Labs   Lab 02/10/22  1123   Sodium 136   Potassium 4.2   Chloride 106   CO2 19*   UN 8   Creatinine 0.47*   Glucose 99    Recent Labs   Lab 02/10/22  1123   ALT 11   AST 15   Alk Phos 103   Bilirubin,Total <0.2      Recent Labs   Lab 02/10/22  1123   Protein,UR 17*   Creatinine,UR 125     Urine spot P/C ratio:      Lab results: 02/10/22  1123   TP Creatinine ratio,UR 0.14             Lab results: 12/26/21  1533 09/02/21  0836   ABO RH Blood Type O RH POS  --    Rubella IgG AB POSITIVE  --    Syphilis Screen Neg  --    HIV 1&2 ANTIGEN/ANTIBODY Nonreactive  --    HBV S Ag NEG  --    N. gonorrhoeae NAAT (PCR)  --  NEGATIVE   Chlamydia NAAT (PCR)  --  NEGATIVE        Lab results: 12/26/21  1533   Glucose,50gm 1HR 181*      No results for input(s): "GL0", "GL1H", "GL2H", "GL3H" in the last 8760 hours.        Physical Exam     Vitals:    02/10/22 1145 02/10/22 1200 02/10/22 1215 02/10/22  1230   BP: 125/70 111/71 121/80 122/74   Pulse: 71 91 85 79       General Appearance: healthy  Mental Status: Alert and oriented x 3  HEENT: Normocephalic, atraumatic.  Cardiovascular: Regular rate and rhythm with no murmurs  Respiratory: Clear to auscultate  Abdomen: Soft, gravid, non-tender.  Neurological: Decreased, low normal (1+)  Extremities: normal    Fetal Monitoring:  Baseline: 130 bpm  Variability: Moderate (6-25  BPM)  Accelerations: Yes 15X15  Decelerations: None  Category: I  Toco: irritability      Assessment & Plan     Anita Patterson is a 26 y.o. G1P0 at [redacted]w[redacted]d with pregnancy complicated by risks outlined above who presents with dizziness and intermittent headaches, pre-eclampsia work up has been negative and physical exam was reassuring, patient remained normotensive throughout encounter.    1. EFM: Reactive NST.   Category I fetal heart tracing.  2. Tocometer: irritability  3. Labs/Swabs Collected: HELLP labs wnl, Uspot 0.14.  4. Plan: Discharged home in stable condition. Encouraged hydration and considering wearing compression stockings and discussed physiologic changes of pregnancy. Patient reported understanding and agreement with this plan.      D/w Dr. Gwendlyn Deutscher    CARMEN Colonel Bald, MD  Family medicine [facility] Fellow  I saw and evaluated the patient. I agree with the resident's/fellow's findings and plan of care as documented.    Wonda Olds, MD

## 2022-02-13 NOTE — L&D Delivery Note (Cosign Needed)
Delivery Summary Note  Patient: Anita Patterson  Age: 27 y.o.  Date of Birth: 1995-10-10  [id number]  MRN: [id number]    Admission Summary  Date and time of admission: 03/25/2022  2:55 AM Attending Provider: No att. providers found    Active Hospital Problems    Diagnosis     *!*SVD (spontaneous vaginal delivery)     Encounter for induction of labor      Allergies:   Allergies: No Known Allergies (drug, envir, food or latex)  Weight:    Weight: 93.9 kg (207 lb)     OB History   Gravida Para Term Preterm AB Living   3 1 1  0 2 1   SAB IAB Ectopic Multiple Live Births   1 0 0 0 1             Prenatal Labs  ABO RH Blood Type   Date Value Ref Range Status   03/25/2022 O RH POS  Final     Antibody Screen   Date Value Ref Range Status   03/25/2022 Negative  Final     Rubella IgG AB   Date Value Ref Range Status   12/26/2021 POSITIVE  Final     Comment:     TEST METHOD: Multiplex flow immunoassay     HBV S Ag   Date Value Ref Range Status   12/26/2021 NEG NEG Final     Comment:     Test Method: CMIA     Group B Strep Culture   Date Value Ref Range Status   02/17/2022 .  Final     HIV 1&2 ANTIGEN/ANTIBODY   Date Value Ref Range Status   12/26/2021 Nonreactive Nonreactive Final     Comment:     Test Method: CMIA      Dating Information  No LMP recorded. EDD: 03/16/2022, by Patient Reported  Information for the patient's newborn:  Pollie Meyer [[id number]]   Delivery Information  Monse Gabriel Rung  Sex: female Gestational Age: [redacted]w[redacted]d MRN: [id number] PCP: Gerome Apley, MD   Delivery Date/Time: 03/26/2022 12:48 PM   Time of Head Delivery: 03/26/2022 12:47 PM  Delivery Type: Vaginal, Spontaneous        Meconium at time of delivery: none  Delivery Location: labor flr    Labor Onset Date/Time:     Dilation Complete Date/Time: 03/26/2022 12:36 PM     Preterm labor: No Antenatal steroids: None Antibiotics received during labor: No      First Cervical ripening date/time:     None1 Cervical ripening Type:           Rupture Date: 03/25/2022 Rupture Time: 1:25 PM  Details:   Rupture Type: Spontaneous Color: Clear Amount:   Fluid odor:None  Induction: Misoprostol  Induction start date/time2/11/2022 4:28 AM      Augmentation: Oxytocin;AROM 03/25/2022 6:51 PM  Labor complications: None     Providers    Delivering clinician: Cleon Dew, MD   Other personnel:  Provider Role   Sabino Donovan, RN Delivery Nurse   Caralyn Guile, DO ED Resident   Juluis Rainier, RN Registered Nurse (RN)             Anesthesia Method:     Analgesics:        Presentation: Vertex Position: Right Occiput Anterior  Prophylactic Maneuver: No     Shoulder Dystocia: No  Skin to Skin/Bonding: Skin to skin initiation date & time: 03/26/2022  12:48 PM  with: Mom           Resuscitation: Dry;Bulb Suctioning;Tactile Stimulation     Living Status: Living             APGARs Total Color Reflex irritability Breath Heart Rate Muscle Tone Assigned By   (greater than 7 no need for next measurement)   1 min 8  0  2  2  2  2   Rollinson, RN   5 min 9  1  2  2  2  2   Rollinson, RN   10 min                 15 min                 20 min                 25 min                 30 min                   Birth Weight: 3646 g (8 lb 0.6 oz) Height: 22" Head Circumference: 35.5 cm Observed Anomalies:      Cord: 3 Vessels     Complications: Cord around         Cord around: neck     Cord tension: loose     Number of loops: 1       Interventions: reduced       Clamping Delayed: 1  Clamped Date/Time:03/26/2022 12:50 PM  Cord blood disposition: Lab;Refrigerator     Gases sent: No       Stem cell collection -by MD-: No   Maternal Info:   Placenta Delivery Date/Time: 03/26/2022 12:53 PM     Removal: Spontaneous     Appearance: Intact       Disposition: discarded  Bonding:     Stages of Labor:          Stage One:   h   m          Stage Two:  0h  33m          Stage Three:  0h  84m  Episiotomy: None       Lacerations: Perineal  Labial          1st   Repaired               Labial laceration: left Repaired              Vaginal Hematoma:No.        Needle Count: Correct        Sponge Count: Correct      Procedures: None       Vaginal Delivery Blood Loss mL: 548         FSE (Active)   Placement Date/Time: 03/26/22 0411   Inserted by: Darrol Angel, MD  Insertion attempts: 1  Patient Tolerance: Tolerated well     Intraprocedure I/O Totals       None             Delivery Narrative:    Meyli Vicente is a 27 y.o. G3P0020, now P1021, admitted at [redacted]w[redacted]d gestation for IOL for late term.  Pregnancy complicated by abnormal 1hr GTT, anxiety and mother smoking 3 cigarettes daily. Patient presented for augmentation of labor and was given misoprostol (x3) and then switched to  pitocin when cervix was favorable. She had an epidural for pain control. She progressed to fully dilated and started pushing. She delivered a viable female infant. The head was delivered with maternal efforts, and a nuchal cord was easily reduced. The shoulders were then delivered without difficulty and the infant was placed on mother's chest for skin-to-skin. The infant was suctioned and stimulated. The cord was clamped and cut by FOB after 1 minute. The placed was spontaneously delivered shortly after. There was a first degree perineal laceration and a left labial laceration that were repaired. Total delivery blood loss was .         The patient progressed to fully dilated and began pushing.  There was delivery of a viable girl infant in the vertex presentation via normal spontaneous vaginal delivery weighing 3646 grams with Apgars of 8 and 9.   Nuchal cord: Yes   NICU in attendance: No   Spontaneous delivery of placenta intact: Yes   Lacerations: 1st degree perineal laceration and left labial laceration   Maternal condition: Good   Infant condition: Good    Dr. Donnalee Curry present for delivery.    OB attending addendum: I was present throughout the entire  procedure.   As above, Dalyn had an uncomplicated precipitous SVD to deliver a viable female infant over an intact perineum. Weight and apgars as above. The head delivered with only maternal expulsive efforts, followed quickly by the anterior shoulder and rest of the body. Prior to the body delivery, a nuchal cord was easily reduced. The Baby was placed skin to skin with mom. The cord was clamped and cut after delay. The placenta delivered spontaneously. There was a labial laceration  and first degree laceration that were repaired. QBL as above. Mom and baby tolerated the delivery well.     Aura Camps, M.D.

## 2022-02-15 ENCOUNTER — Encounter: Payer: Self-pay | Admitting: Gastroenterology

## 2022-02-17 ENCOUNTER — Other Ambulatory Visit
Admission: RE | Admit: 2022-02-17 | Discharge: 2022-02-17 | Disposition: A | Discharge status: Home / Self Care | Payer: PRIVATE HEALTH INSURANCE | Source: Ambulatory Visit | Attending: Obstetrics and Gynecology | Admitting: Obstetrics and Gynecology

## 2022-02-17 DIAGNOSIS — Z3483 Encounter for supervision of other normal pregnancy, third trimester: Secondary | ICD-10-CM | POA: Insufficient documentation

## 2022-02-18 LAB — GROUP B STREP CULTURE: Group B Strep Culture: 0

## 2022-02-28 ENCOUNTER — Encounter: Payer: Self-pay | Admitting: Gastroenterology

## 2022-03-14 ENCOUNTER — Other Ambulatory Visit
Admission: RE | Admit: 2022-03-14 | Discharge: 2022-03-14 | Disposition: A | Discharge status: Home / Self Care | Payer: PRIVATE HEALTH INSURANCE | Source: Ambulatory Visit | Attending: Physician Assistant | Admitting: Physician Assistant

## 2022-03-14 DIAGNOSIS — Z3483 Encounter for supervision of other normal pregnancy, third trimester: Secondary | ICD-10-CM | POA: Insufficient documentation

## 2022-03-14 LAB — VAGINITIS SCREEN: DNA PROBE: Vaginitis Screen:DNA Probe: POSITIVE — AB

## 2022-03-15 LAB — AEROBIC CULTURE: Aerobic Culture: 0

## 2022-03-24 ENCOUNTER — Encounter: Payer: Self-pay | Admitting: Obstetrics and Gynecology

## 2022-03-24 ENCOUNTER — Other Ambulatory Visit: Payer: Self-pay

## 2022-03-24 ENCOUNTER — Observation Stay
Admission: AD | Admit: 2022-03-24 | Discharge: 2022-03-24 | Disposition: A | Discharge status: Home / Self Care | Payer: PRIVATE HEALTH INSURANCE | Source: Ambulatory Visit | Attending: Obstetrics and Gynecology | Admitting: Obstetrics and Gynecology

## 2022-03-24 DIAGNOSIS — O99891 Other specified diseases and conditions complicating pregnancy: Principal | ICD-10-CM | POA: Insufficient documentation

## 2022-03-24 DIAGNOSIS — Z3490 Encounter for supervision of normal pregnancy, unspecified, unspecified trimester: Principal | ICD-10-CM

## 2022-03-24 DIAGNOSIS — R519 Headache, unspecified: Secondary | ICD-10-CM | POA: Insufficient documentation

## 2022-03-24 DIAGNOSIS — Z3A41 41 weeks gestation of pregnancy: Secondary | ICD-10-CM | POA: Insufficient documentation

## 2022-03-24 DIAGNOSIS — O368130 Decreased fetal movements, third trimester, not applicable or unspecified: Secondary | ICD-10-CM

## 2022-03-24 LAB — COMPREHENSIVE METABOLIC PANEL
ALT: 5 U/L (ref 0–35)
AST: 10 U/L (ref 0–35)
Albumin: 3.7 g/dL (ref 3.5–5.2)
Alk Phos: 142 U/L — ABNORMAL HIGH (ref 35–105)
Anion Gap: 14 (ref 7–16)
Bilirubin,Total: <0.2 mg/dL (ref 0.0–1.2)
CO2: 18 mmol/L — ABNORMAL LOW (ref 20–28)
Calcium: 9.6 mg/dL (ref 8.8–10.2)
Chloride: 104 mmol/L (ref 96–108)
Creatinine: 0.53 mg/dL (ref 0.51–0.95)
Glucose: 91 mg/dL (ref 60–99)
Lab: 12 mg/dL (ref 6–20)
Potassium: 4.6 mmol/L (ref 3.3–5.1)
Sodium: 136 mmol/L (ref 133–145)
Total Protein: 6.7 g/dL (ref 6.3–7.7)
eGFR BY CREAT: 129 *

## 2022-03-24 LAB — PROTEIN,UR + CREAT,UR WITH RATIO
Creatinine,UR: 233 mg/dL (ref 20–300)
Protein,UR: 48 mg/dL — ABNORMAL HIGH (ref 0–11)
TP Creatinine ratio,UR: 0.21

## 2022-03-24 LAB — CBC AND DIFFERENTIAL
Baso # K/uL: 0 10*3/uL (ref 0.0–0.2)
Basophil %: 0.2 %
Eos # K/uL: 0.1 10*3/uL (ref 0.0–0.5)
Eosinophil %: 1.1 %
Hematocrit: 36 % (ref 34–49)
Hemoglobin: 12.3 g/dL (ref 11.2–16.0)
IMM Granulocytes #: 0.1 10*3/uL — ABNORMAL HIGH (ref 0.0–0.0)
IMM Granulocytes: 0.5 %
Lymph # K/uL: 2.3 10*3/uL (ref 1.0–5.0)
Lymphocyte %: 20.3 %
MCH: 29 pg (ref 26–32)
MCHC: 34 g/dL (ref 32–36)
MCV: 86 fL (ref 75–100)
Mono # K/uL: 0.9 10*3/uL (ref 0.1–1.0)
Monocyte %: 8.2 %
Neut # K/uL: 7.7 10*3/uL — ABNORMAL HIGH (ref 1.5–6.5)
Nucl RBC # K/uL: 0 10*3/uL (ref 0.0–0.0)
Nucl RBC %: 0 /100 WBC (ref 0.0–0.2)
Platelets: 281 10*3/uL (ref 150–450)
RBC: 4.2 MIL/uL (ref 4.0–5.5)
RDW: 14.5 % (ref 0.0–15.0)
Seg Neut %: 69.7 %
WBC: 11.1 10*3/uL — ABNORMAL HIGH (ref 3.5–11.0)

## 2022-03-24 MED ORDER — SODIUM CHLORIDE 0.9 % 25 ML IV SOLN *I*
10.0000 mg | Freq: Once | INTRAVENOUS | Status: AC
Start: 2022-03-24 — End: 2022-03-24
  Administered 2022-03-24: 10 mg via INTRAVENOUS
  Filled 2022-03-24: qty 2

## 2022-03-24 MED ORDER — METOCLOPRAMIDE HCL 10 MG PO TABS *I*
10.0000 mg | ORAL_TABLET | Freq: Once | ORAL | Status: AC
Start: 2022-03-24 — End: 2022-03-24
  Administered 2022-03-24: 10 mg via ORAL
  Filled 2022-03-24: qty 1

## 2022-03-24 MED ORDER — DIPHENHYDRAMINE HCL 50 MG/ML IJ SOLN *I*
25.0000 mg | Freq: Once | INTRAMUSCULAR | Status: DC
Start: 2022-03-24 — End: 2022-03-24

## 2022-03-24 MED ORDER — ACETAMINOPHEN 500 MG PO TABS *I*
1000.0000 mg | ORAL_TABLET | Freq: Once | ORAL | Status: DC
Start: 2022-03-24 — End: 2022-03-24

## 2022-03-24 NOTE — Procedures (Addendum)
Fetal Non-Stress Test    Patient: Anita Patterson Age: 27 y.o.  Date of Birth: 1995/11/02  ZOX:WRUEAV  eMRN: [id number]  GA: [redacted]w[redacted]d  Maternal  Heart Rate: 89 (03/24/22 1315)  BP: 125/79 (03/24/22 1315)    Reason For Exam: Decreased fetal movement  Education Done:  External fetal monitoring NST    Fetal non-stress test for singleton pregnancy  Pre Procedure Diagnosis: [redacted] weeks gestation of pregnancy  Post Procedure Diagnosis: NST (non-stress test) reactive            NST Start Time:  03/24/2022 12:37 PM            Uterine Irritability: No            Contractions: irregular    NST Fetus A  Variability: moderate  FHR Baseline: 135  Decelerations: none   Accelerations: at least 15 BPM for 15 seconds  Stimulation: none  NST Interpretation: reactive    End Time:  03/24/2022 1:00 PM          Duration of test (min): 23  Location of NST Fetal Heart Tracing: Archived electronically  Reviewed with:  Sp[place]      Test performed By: Charlaine Dalton, RN  03/24/2022 2:51 PM

## 2022-03-24 NOTE — H&P (Shared)
OBSTETRICS EVALUATION      Referring Hospital (if applicable): n/a  Primary OB-GYN: [facility]    Chief Complaint: Headache    HPI     Anita Patterson is a 27 y.o. G1P0 at [redacted]w[redacted]d by LMP c/w 7w ultrasound with pregnancy complicated by risks outlined below who presents headache.    Pt states that she developed headache last night. She took 1000 mg of Tylenol without relief. Her headache persisted this morning so she took another dose of 1000 mg Tylenol without relief.  She also felt intermittent chills and diaphoresis overnight, and notes nausea and multiple episodes of vomiting this morning. Her nausea persists. She notes worsened leg edema over the past 24 hours as well. Denies acute vision changes (but has chronic floaters), RUQ pain, shortness of breath, or chest pain.     She reports decreased fetal movement over the past 5 days, more noticeable over the past 2 days. She denies leakage of fluid, vaginal bleeding, vaginal discharge, and contractions currently. Has been intermittently experiencing braxton hicks contractions.    Patient's medications, allergies, past medical, surgical, social and family histories were recorded, reviewed and updated as appropriate.     Pregnancy Risks     Abnormal 1 hr GTT  - Did not complete 3hr  - Has not been paneling sugars     Anxiety  - Not connected with BH   - Not on meds currently     Cigarette Smoker  - Down to 1-2 cigarettes a day    Obstetrical History     OB History   Gravida Para Term Preterm AB Living   1             SAB IAB Ectopic Multiple Live Births                  # Outcome Date GA Lbr Len/2nd Weight Sex Delivery Anes PTL Lv   1 Current                  Prenatal Labs     No results for input(s): "WBC", "HGB", "HCT", "PLT" in the last 168 hours.  No results for input(s): "NA", "K", "CL", "CO2", "UN", "CREAT", "GFRC", "GLU" in the last 168 hours. No results for input(s): "LD", "URIC", "ALT", "AST", "ALK", "TB" in the last 168 hours.   No results for input(s): "UTPR",  "UCRR" in the last 168 hours.    Urine spot P/C ratio:      Lab results: 02/10/22  1123   TP Creatinine ratio,UR 0.14             Lab results: 02/17/22  1600 12/26/21  1533 09/02/21  0836   ABO RH Blood Type  --  O RH POS  --    Rubella IgG AB  --  POSITIVE  --    Group B Strep Culture .  --   --    Syphilis Screen  --  Neg  --    HIV 1&2 ANTIGEN/ANTIBODY  --  Nonreactive  --    HBV S Ag  --  NEG  --    N. gonorrhoeae NAAT (PCR)  --   --  NEGATIVE   Chlamydia NAAT (PCR)  --   --  NEGATIVE        Lab results: 12/26/21  1533   Glucose,50gm 1HR 181*      No results for input(s): "GL0", "GL1H", "GL2H", "GL3H" in the last 8760 hours.  Prenatal Ultrasound Review (Pertinent Findings):  09/01/21 NT 1.2 mm    10/27/21 EIF (but normal genetic screening) otherwise normal anatomy, paramarginal cord insertion    01/16/22 EFW wnl (1814 g), normal cord insertion       Physical Exam     Vitals:    03/24/22 1136   BP: 136/88   Pulse: 96   Resp: 16   Temp: 36.2 C (97.2 F)   TempSrc: Temporal   SpO2: 97%   Weight: 93.9 kg (207 lb)   Height: 1.651 m (5\' 5" )           Physical Exam   Constitutional: She is oriented to person, place, and time. She appears well-developed and well-nourished. No distress.   HENT:   Head: Normocephalic and atraumatic.   Cardiovascular: Normal rate and regular rhythm.   Pulmonary/Chest: Effort normal and breath sounds normal.   Abdominal: Soft. There is no abdominal tenderness.   Gravid   Musculoskeletal: Normal range of motion.   Neurological: She is alert and oriented to person, place, and time.   Skin: Skin is warm. No rash noted.   Psychiatric: She has a normal mood and affect.          Cervix not examined     Estimated Fetal Weight: 3600 grams by Thayer Ohm maneuvers    Presentation: cephalic by ultrasound    Placental location: {ob gyn placenta location:312808}      Fetal Monitoring:  Baseline: *** bpm  Variability: {variability:304455005}  Accelerations: {ACCELERATIONS:34027}  Decelerations:  {Decelerations:[phone]}  Category: ***  Toco: {Tocometer:999004096}      Assessment & Plan     {Choose which A&P you'd like for this visit!:999004092}

## 2022-03-24 NOTE — OB Triage Note (Signed)
Pt ambulated to triage.  Complains of headache since yesterday unrelieved by tylenol, decreased fetal movement, and nausea and vomiting.  Denies loss of fluid or bleeding.  Residents notified

## 2022-03-24 NOTE — OB Triage Note (Signed)
Pt d/c'd home ambulatory undelivered. D/c instructions reviewed with pt. Pt verbalizes understanding to call MD with bleeding, ROM, decreased fetal movement, contractions that are more frequent or intense or any other concerns.

## 2022-03-24 NOTE — OB Triage Note (Signed)
OBSTETRICS TRIAGE NOTE      Chief Complaint: headache    HPI     Anita Patterson is a 27 y.o. G1P0 at [redacted]w[redacted]d by LMP c/w ultrasound with pregnancy complicated by risks outlined below who presents with headache that started last night. She took 1000mg  tylenol around 10pm yesterday and around 8am this morning without improvement of her symptoms. She states that she also has some chills/diaphoresis and N/V that started this morning as well. She has noticed floaters in her vision but these are not new and have not changed. She denies RUQ pain, SOB, CP or syncope. She rates the headache now at a 7 out of 10.      She denies LOF, vaginal bleeding or decreased fetal movement. She has been feeling intermittent Braxton Hicks contractions. She was last seen by her OBGYN on Monday and was at 2.5 at this visit.     Pregnancy Risks     Abnormal 1 hr GTT  - Did not complete 3hr  - Has not been paneling sugars      Anxiety  - Not connected with BH   - Not on meds currently      Cigarette Smoker  - Down to 1-2 cigarettes a day    Obstetrical History     OB History   Gravida Para Term Preterm AB Living   1             SAB IAB Ectopic Multiple Live Births                  # Outcome Date GA Lbr Len/2nd Weight Sex Delivery Anes PTL Lv   1 Current                  PMH, PSH, SH, GYN History, Allergies, and Current Medications reviewed and updated in eRecord.       Review of Systems     Constitutional: Negative for fever or fatigue.  Psych: Denies depressive symptoms or anxiety.  Cardiovascular: Denies chest pain or palpitations.  Respiratory: Denies shortness of breath or orthopnea.  Gastrointestinal: Denies nausea/vomiting. No abdominal pain.  No flank pain.  Musculoskeletal: Denies muscle weakness.  Skin/Extremities: No peripheral edema.  Denies new skin rashes or lesions.  Neurologic:  headache. No vision changes.  Denies syncope.  Denies recent falls.  Genitourinary: Denies dysuria.  Denies urinary frequency or urgency.  Denies  hematuria.  Denies flank pain.  Denies vaginal bleeding.  Denies leakage of fluid.      Prenatal Labs     Recent Labs   Lab 03/24/22  1200   WBC 11.1*   Hemoglobin 12.3   Hematocrit 36   Platelets 281     Recent Labs   Lab 03/24/22  1200   Sodium 136   Potassium 4.6   Chloride 104   CO2 18*   UN 12   Creatinine 0.53   Glucose 91    Recent Labs   Lab 03/24/22  1200   ALT 5   AST 10   Alk Phos 142*   Bilirubin,Total <0.2      No results for input(s): "UTPR", "UCRR" in the last 168 hours.  Urine spot P/C ratio:      Lab results: 02/10/22  1123   TP Creatinine ratio,UR 0.14             Lab results: 02/17/22  1600 12/26/21  1533 09/02/21  0836   ABO RH Blood Type  --  O RH POS  --    Rubella IgG AB  --  POSITIVE  --    Group B Strep Culture .  --   --    Syphilis Screen  --  Neg  --    HIV 1&2 ANTIGEN/ANTIBODY  --  Nonreactive  --    HBV S Ag  --  NEG  --    N. gonorrhoeae NAAT (PCR)  --   --  NEGATIVE   Chlamydia NAAT (PCR)  --   --  NEGATIVE        Lab results: 12/26/21  1533   Glucose,50gm 1HR 181*      No results for input(s): "GL0", "GL1H", "GL2H", "GL3H" in the last 8760 hours.        Physical Exam     Vitals:    03/24/22 1136 03/24/22 1139 03/24/22 1200   BP: 136/88 126/89 126/89   Pulse: 96     Resp: 16     Temp: 36.2 C (97.2 F)     TempSrc: Temporal     SpO2: 97%     Weight: 93.9 kg (207 lb)     Height: 1.651 m (5\' 5" )         General Appearance: healthy, alert, no distress, cooperative, and smiling  Mental Status: Alert and oriented x 3  Mood/Affect: Mood  happy  Skin: color normal, no evidence of bleeding or bruising, no lesions noted, no edema, temperature normal, and mobility and turgor normal  HEENT: Normocephalic, atraumatic.  Cardiovascular: Not assessed  Respiratory: Not assessed  Abdomen: Soft, gravid, non-tender. No evidence of umbilical hernia. No evidence of gross hepatomegaly or tenderness at the liver edge.  Uterus: Gravid.  40 weeks size.  Non-tender to palpation.  Neurological: Not  assessed  Extremities: normal      Estimated Fetal Weight: 3400 grams by Thayer Ohm maneuvers    Presentation: cephalic by ultrasound done by co-resident      Fetal Monitoring:  Baseline:  bpm  Variability: Moderate (6-25 BPM)  Accelerations: Yes 15X15  Decelerations: None  Category: I  Toco: no CTX observed      Assessment & Plan     Anita Patterson is a 27 y.o. G1P0 at [redacted]w[redacted]d with pregnancy complicated by risks outlined above who presents with headache.    1. EFM: Reactive NST.   Category I fetal heart tracing.  2. Tocometer: No CTX observed  3. Plan: Reglan, compazine, CBC, CMP, protein/creatinine ratio, NST.    Patient states headache went from a 7 to a 1-2 out of 10 with medications and she is feeling much better. Her labs, BP and NST were all reassuring. Patient was given option to stay on [place] 3 for observation or to go home, and she agreed with discharge at this time. She is on the induction list for later today or tomorrow. She was given strict return precautions, which she expressed understanding to.       D/w Dr. Arty Baumgartner and OB team.     Caralyn Guile, DO   Emergency Medicine, PGY-1

## 2022-03-25 ENCOUNTER — Observation Stay: Payer: PRIVATE HEALTH INSURANCE | Admitting: Anesthesiology

## 2022-03-25 ENCOUNTER — Encounter: Payer: Self-pay | Admitting: Obstetrics and Gynecology

## 2022-03-25 ENCOUNTER — Other Ambulatory Visit: Payer: Self-pay

## 2022-03-25 ENCOUNTER — Inpatient Hospital Stay
Admission: RE | Admit: 2022-03-25 | Discharge: 2022-03-27 | DRG: 807 | Disposition: A | Discharge status: Home / Self Care | Payer: PRIVATE HEALTH INSURANCE | Source: Intra-hospital | Attending: Obstetrics and Gynecology | Admitting: Obstetrics and Gynecology

## 2022-03-25 DIAGNOSIS — Z3A41 41 weeks gestation of pregnancy: Secondary | ICD-10-CM

## 2022-03-25 DIAGNOSIS — O480 Post-term pregnancy: Principal | ICD-10-CM | POA: Diagnosis present

## 2022-03-25 DIAGNOSIS — Z3490 Encounter for supervision of normal pregnancy, unspecified, unspecified trimester: Secondary | ICD-10-CM

## 2022-03-25 DIAGNOSIS — F17210 Nicotine dependence, cigarettes, uncomplicated: Secondary | ICD-10-CM | POA: Diagnosis present

## 2022-03-25 DIAGNOSIS — O99334 Smoking (tobacco) complicating childbirth: Secondary | ICD-10-CM | POA: Diagnosis present

## 2022-03-25 LAB — HOLD SST

## 2022-03-25 LAB — COMPREHENSIVE METABOLIC PANEL
ALT: 7 U/L (ref 0–35)
AST: 12 U/L (ref 0–35)
Albumin: 3.5 g/dL (ref 3.5–5.2)
Alk Phos: 134 U/L — ABNORMAL HIGH (ref 35–105)
Anion Gap: 15 (ref 7–16)
Bilirubin,Total: <0.2 mg/dL (ref 0.0–1.2)
CO2: 18 mmol/L — ABNORMAL LOW (ref 20–28)
Calcium: 9.2 mg/dL (ref 8.8–10.2)
Chloride: 101 mmol/L (ref 96–108)
Creatinine: 0.51 mg/dL (ref 0.51–0.95)
Glucose: 118 mg/dL — ABNORMAL HIGH (ref 60–99)
Lab: 9 mg/dL (ref 6–20)
Potassium: 4 mmol/L (ref 3.3–5.1)
Sodium: 134 mmol/L (ref 133–145)
Total Protein: 6.2 g/dL — ABNORMAL LOW (ref 6.3–7.7)
eGFR BY CREAT: 130 *

## 2022-03-25 LAB — CBC
Hematocrit: 34 % (ref 34–49)
Hemoglobin: 11.7 g/dL (ref 11.2–16.0)
MCH: 29 pg (ref 26–32)
MCHC: 34 g/dL (ref 32–36)
MCV: 86 fL (ref 75–100)
Platelets: 272 10*3/uL (ref 150–450)
RBC: 4 MIL/uL (ref 4.0–5.5)
RDW: 14.4 % (ref 0.0–15.0)
WBC: 10.5 10*3/uL (ref 3.5–11.0)

## 2022-03-25 LAB — POCT GLUCOSE: Glucose POCT: 114 mg/dL — ABNORMAL HIGH (ref 60–99)

## 2022-03-25 LAB — TYPE AND SCREEN
ABO RH Blood Type: O POS
Antibody Screen: NEGATIVE

## 2022-03-25 LAB — SYPHILIS SCREEN
Syphilis Screen: NEGATIVE
Syphilis Status: NONREACTIVE

## 2022-03-25 MED ORDER — FENTANYL 2 MCG/ML AND 0.125% BUPIVACAINE *I*
INTRAMUSCULAR | Status: AC
Start: 2022-03-25 — End: 2022-03-25
  Filled 2022-03-25: qty 150

## 2022-03-25 MED ORDER — OXYTOCIN 30 UNITS IN 500ML NS WRAPPED *I*
1.0000 m[IU]/min | INTRAMUSCULAR | Status: DC
Start: 2022-03-25 — End: 2022-03-26
  Administered 2022-03-25: 6 m[IU]/min via INTRAVENOUS
  Administered 2022-03-25: 12 m[IU]/min via INTRAVENOUS
  Administered 2022-03-25: 2 m[IU]/min via INTRAVENOUS
  Administered 2022-03-25: 4 m[IU]/min via INTRAVENOUS
  Administered 2022-03-25: 8 m[IU]/min via INTRAVENOUS
  Administered 2022-03-25 (×2): 10 m[IU]/min via INTRAVENOUS
  Administered 2022-03-25: 6 m[IU]/min via INTRAVENOUS
  Administered 2022-03-25 (×2): 8 m[IU]/min via INTRAVENOUS
  Administered 2022-03-25: 4 m[IU]/min via INTRAVENOUS
  Administered 2022-03-26 (×2): 18 m[IU]/min via INTRAVENOUS
  Administered 2022-03-26: 16 m[IU]/min via INTRAVENOUS
  Administered 2022-03-26 (×2): 18 m[IU]/min via INTRAVENOUS
  Administered 2022-03-26: 16 m[IU]/min via INTRAVENOUS
  Administered 2022-03-26: 14 m[IU]/min via INTRAVENOUS
  Administered 2022-03-26: 16 m[IU]/min via INTRAVENOUS
  Administered 2022-03-26 (×2): 14 m[IU]/min via INTRAVENOUS
  Administered 2022-03-26: 16 m[IU]/min via INTRAVENOUS
  Administered 2022-03-26: 14 m[IU]/min via INTRAVENOUS
  Filled 2022-03-25: qty 500

## 2022-03-25 MED ORDER — LACTATED RINGERS IV BOLUS *I*
1000.0000 mL | INTRAVENOUS | Status: DC | PRN
Start: 2022-03-25 — End: 2022-03-25

## 2022-03-25 MED ORDER — LACTATED RINGERS IV BOLUS *I*
1000.0000 mL | INTRAVENOUS | Status: DC | PRN
Start: 2022-03-25 — End: 2022-03-26
  Administered 2022-03-25 – 2022-03-26 (×3): 1000 mL via INTRAVENOUS

## 2022-03-25 MED ORDER — LACTATED RINGERS IV SOLN *I*
150.0000 mL/h | INTRAVENOUS | Status: DC
Start: 2022-03-25 — End: 2022-03-25

## 2022-03-25 MED ORDER — PROMETHAZINE HCL 25 MG PO TABS *I*
25.0000 mg | ORAL_TABLET | Freq: Once | ORAL | Status: AC
Start: 2022-03-25 — End: 2022-03-25
  Administered 2022-03-25: 25 mg via ORAL
  Filled 2022-03-25: qty 1

## 2022-03-25 MED ORDER — SODIUM CHLORIDE 0.9 % FLUSH FOR PUMPS *I*
0.0000 mL/h | INTRAVENOUS | Status: DC | PRN
Start: 2022-03-25 — End: 2022-03-25

## 2022-03-25 MED ORDER — ONDANSETRON HCL 2 MG/ML IV SOLN *I*
4.0000 mg | Freq: Three times a day (TID) | INTRAMUSCULAR | Status: DC | PRN
Start: 2022-03-25 — End: 2022-03-26
  Administered 2022-03-25 (×2): 4 mg via INTRAVENOUS
  Filled 2022-03-25 (×2): qty 2

## 2022-03-25 MED ORDER — LACTATED RINGERS IV SOLN *I*
150.0000 mL/h | INTRAVENOUS | Status: DC
Start: 2022-03-25 — End: 2022-03-26
  Administered 2022-03-25 – 2022-03-26 (×2): 150 mL/h via INTRAVENOUS

## 2022-03-25 MED ORDER — SODIUM CHLORIDE 0.9 % FLUSH FOR PUMPS *I*
0.0000 mL/h | INTRAVENOUS | Status: DC | PRN
Start: 2022-03-25 — End: 2022-03-27

## 2022-03-25 MED ORDER — LIDOCAINE-EPINEPHRINE (PF) 1.5 %-1:200000 IJ SOLN *I*
INTRAMUSCULAR | Status: DC | PRN
Start: 2022-03-25 — End: 2022-03-26
  Administered 2022-03-25: 2 mL via EPIDURAL
  Administered 2022-03-25: 3 mL via EPIDURAL

## 2022-03-25 MED ORDER — DEXTROSE 5 % FLUSH FOR PUMPS *I*
0.0000 mL/h | INTRAVENOUS | Status: DC | PRN
Start: 2022-03-25 — End: 2022-03-25

## 2022-03-25 MED ORDER — MORPHINE SULFATE 10 MG/ML IV SOLN *WRAPPED*
10.0000 mg | Freq: Once | INTRAVENOUS | Status: AC
Start: 2022-03-25 — End: 2022-03-25
  Administered 2022-03-25: 10 mg via INTRAMUSCULAR
  Filled 2022-03-25: qty 1

## 2022-03-25 MED ORDER — FENTANYL 2 MCG/ML AND 0.125% BUPIVACAINE *I*
10.0000 mL/h | INTRAMUSCULAR | Status: DC | PRN
Start: 2022-03-25 — End: 2022-03-26
  Administered 2022-03-25: 10 mL/h via EPIDURAL

## 2022-03-25 MED ORDER — LIDOCAINE HCL 1 % IJ SOLN *I*
INTRAMUSCULAR | Status: DC | PRN
Start: 2022-03-25 — End: 2022-03-26
  Administered 2022-03-25: 3 mL via SUBCUTANEOUS

## 2022-03-25 MED ORDER — DEXTROSE 5 % FLUSH FOR PUMPS *I*
0.0000 mL/h | INTRAVENOUS | Status: DC | PRN
Start: 2022-03-25 — End: 2022-03-27

## 2022-03-25 MED ORDER — MISOPROSTOL 25 MCG QUARTER TAB *I*
50.0000 ug | ORAL_TABLET | ORAL | Status: AC
Start: 2022-03-25 — End: 2022-03-25
  Administered 2022-03-25 (×3): 50 ug via ORAL
  Filled 2022-03-25 (×3): qty 2

## 2022-03-25 NOTE — Progress Notes (Signed)
Patient arrived to the labor floor ambulatory for induction of post-dates. Patient reports no loss of fluid, no vaginal bleeding, and cramping the past 5 days. Reports normal fetal movement. Patient settled into room 303, oriented to room and hooked up to EFM/TOCO. Vitals within normal limits. Will continue to follow patients plan of care.    Almira Coaster, RN

## 2022-03-25 NOTE — Progress Notes (Signed)
OBSTETRICS PROGRESS NOTE       Subjective     States she is feeling contractions, beginning to be more specific and constant at this point. She has no further questions right now.      Objective     Vitals:    03/25/22 1600 03/25/22 1700 03/25/22 1820 03/25/22 1855   BP:  139/79 133/77 (!) 141/86   Pulse:    80   Resp:       Temp: 35.9 C (96.6 F) 36 C (96.8 F) 36 C (96.8 F) 36.5 C (97.7 F)   TempSrc: Oral Axillary Axillary Axillary   SpO2:       Weight:       Height:           Most recent cervical exam:   OB Examiner: Harris-Glocker, MD (03/25/22 1738)  Dilation: 4  Effacement (%): 70  Station: -2    Membranes:   Membrane Status: Spontaneous   Color: Clear   Rupture Date: 03/25/22  Rupture Time: 1325    Fetal Monitoring:  Baseline: 135 bpm  Variability: moderate  Accelerations: present  Decelerations: 1800 decel of 100 bpm for approximately 3 minutes, but patient states she keeps moving in bed. Returned to baseline soon afterward.  Category: I  Toco: variable, recently q3-3.5      Assessment & Plan     Anita Patterson is a 27 y.o. G3P0020 at [redacted]w[redacted]d admitted for IOL for late term.    1. FHT: Category I. Interventions: repositioning and IVF.  2. Labor assessment: Continued with miso, and will switch to pit when appropriate (received M&P).  3. Pain management: controlled as of now, still considering options of epidural.  4. GBS status: negative  5. Labor Risks:   - Late term    Abnormal 1 hr GTT  - Did not complete 3hr  - Has not been paneling sugars      Anxiety  - Not connected with BH   - Not on meds currently      Cigarette Smoker  - Smoking 3 cigarettes a day, sometimes 1-2 cigarettes a day depending on how stressed she is          Hollace Kinnier, DO/MPH  PGY1 FM resident

## 2022-03-25 NOTE — Anesthesia Procedure Notes (Signed)
---------------------------------------------------------------------------------------------------------------------------------------    NEURAXIAL BLOCK PLACEMENT  Labor Epidural    Date of Procedure: 03/25/2022 11:35 PM    Patient Location:  L&D    Reason for Block: labor epidural      Block Completion: successfully completed  CONSENT AND TIMEOUT     Consent:  Obtained per policy    Timeout: patient identified (name/DOB) , proper patient position verified, needed equipment, monitors, medications and access verified as present and functioning , allergies reviewed with patient/record , all members of the block team participated in the timeout and anticoagulation/antiplatelet status reviewed  METHOD:    Patient Position: sitting    Monitoring: pulse oximetry for test dose      Labeling: syringes appropriately labeled    Sedation Used: no            For medications used, please see MAR        Level of Sedation: none      Sterile Technique:  Hand sanitizing, hat, mask and sterile gloves    Prep: aseptic technique per protocol, povidone-iodine and site draped      Successful Approach: midline    Successful Location: L3-4    Attempts (Skin Punctures):  1  NEEDLE AND CATHETER:  Needle:    Needle Type: Tuohy    Needle Gauge:  18 G    Needle Length:  9 cm    Needle Insertion Depth:  7  Catheter:     Catheter Size: 20 gauge    Catheter Type: closed tip (multi-orifice)  Tunneled: No      Catheter in Space:  6    Catheter at Skin: 13  TESTING AND VALIDATION:    Catheter Aspirate: negative      Test Dose Response: negative  OBSERVATIONS:    Block Completion:  Successfully completed      Wet Tap: No      Paresthesia: none      Neuraxial Blood: blood not aspirated      Motor Block: mild      Patient Reaction to Block: tolerated procedure well    STAFF     Performed by Rhetta Mura, CRNA      Attest: I was present for the entire procedure   Lillie Fragmin,  MD  ----------------------------------------------------------------------------------------------------------------------------------------

## 2022-03-25 NOTE — Progress Notes (Signed)
OBSTETRICS PROGRESS NOTE       Subjective     Patient states that she is starting to feel some more cramping that is almost constant at this point, with no clear contractions. She is currently laying comfortably in bed, and has not questions or concerns at this time.        Objective     Vitals:    03/25/22 0300 03/25/22 0600 03/25/22 0800   BP: 133/84 104/85 111/64   Pulse: 90 93    Resp: 18 18    Temp: 35.9 C (96.6 F) 36 C (96.8 F) 35.9 C (96.6 F)   TempSrc: Temporal Temporal Temporal   SpO2: 99% 98%    Weight: 93.9 kg (207 lb)     Height: 1.651 m (5\' 5" )         Most recent cervical exam:   OB Examiner: Dr. Bonnielee Haff, MD (03/25/22 1610)  Dilation: 2  Effacement (%): 50  Station: -3    Membranes:   Membrane Status: Intact           Fetal Monitoring:  Baseline: 140 bpm  Variability: moderate variablex1  ?maternal  Accelerations: present  Decelerations: There is a drop in fetal tracing to around 100bpm for approximately one minute, This is when patient was moving in bed. Returned to baseline afterward.   Category: I  Toco: q8-10 minutes      Assessment & Plan     Anita Patterson is a 27 y.o. G3P0020 at [redacted]w[redacted]d admitted for IOL for later term.    1. FHT: Category I. Interventions: repositioning and IVF.  2. Labor assessment: Will continue with miso and switch to pitocin when appropriate.  3. Pain management: currently controlled.  4. GBS status: negative  5. Labor Risks:   - - Late term     Abnormal 1 hr GTT  - Did not complete 3hr  - Has not been paneling sugars      Anxiety  - Not connected with BH   - Not on meds currently      Cigarette Smoker  - Smoking 3 cigarettes a day, sometimes 1-2 cigarettes a day depending on how stressed she is      Caralyn Guile, DO   Emergency Medicine, PGY-1

## 2022-03-25 NOTE — H&P (Signed)
OBSTETRICS EVALUATION        Chief Complaint: IOL    HPI     Anita Patterson is a 27 y.o. G3P0020 at [redacted]w[redacted]d by LMP c/w ultrasound with pregnancy complicated by risks outlined below who presents with IOL for late term. Patient states she is primarily here for her induction of labor. She has been having cramping, which began 5 days prior. On 03/24/22 she experienced a headache with visual floaters and proceeded to [facility] and then placed on the induction list after being given a headache cocktail. Since this cocktail, she has not experienced a new headache. States visual floaters are usual for her. Otherwise denies vaginal bleeding, gush of fluids, discharge. Has felt baby movement.     Patient's medications, allergies, past medical, surgical, social and family histories were recorded, reviewed and updated as appropriate.     Pregnancy Risks     Late term    Abnormal 1 hr GTT  - Did not complete 3hr  - Has not been paneling sugars      Anxiety  - Not connected with BH   - Not on meds currently      Cigarette Smoker  - Smoking 3 cigarettes a day, sometimes 1-2 cigarettes a day depending on how stressed she is       Obstetrical History     OB History   Gravida Para Term Preterm AB Living   3       2     SAB IAB Ectopic Multiple Live Births   1              # Outcome Date GA Lbr Len/2nd Weight Sex Delivery Anes PTL Lv   3 Current            2 SAB 2021           1 AB 2019                 Prenatal Labs     Recent Labs   Lab 03/25/22  0346 03/24/22  1200   WBC 10.5 11.1*   Hemoglobin 11.7 12.3   Hematocrit 34 36   Platelets 272 281     Recent Labs   Lab 03/24/22  1200   Sodium 136   Potassium 4.6   Chloride 104   CO2 18*   UN 12   Creatinine 0.53   Glucose 91    Recent Labs   Lab 03/24/22  1200   ALT 5   AST 10   Alk Phos 142*   Bilirubin,Total <0.2      Recent Labs   Lab 03/24/22  1130   Protein,UR 48*   Creatinine,UR 233       Urine spot P/C ratio:      Lab results: 03/24/22  1130   TP Creatinine ratio,UR 0.21              Lab results: 02/17/22  1600 12/26/21  1533 09/02/21  0836   ABO RH Blood Type  --  O RH POS  --    Rubella IgG AB  --  POSITIVE  --    Group B Strep Culture .  --   --    Syphilis Screen  --  Neg  --    HIV 1&2 ANTIGEN/ANTIBODY  --  Nonreactive  --    HBV S Ag  --  NEG  --    N. gonorrhoeae NAAT (PCR)  --   --  NEGATIVE   Chlamydia NAAT (PCR)  --   --  NEGATIVE        Lab results: 12/26/21  1533   Glucose,50gm 1HR 181*      No results for input(s): "GL0", "GL1H", "GL2H", "GL3H" in the last 8760 hours.        Prenatal Ultrasound Review (Pertinent Findings):    09/01/21 NT 1.2 mm     10/27/21 EIF (but normal genetic screening) otherwise normal anatomy, paramarginal cord insertion     01/16/22 EFW wnl (1814 g), normal cord insertion     Physical Exam     Vitals:    03/25/22 0300   BP: 133/84   Pulse: 90   Resp: 18   Temp: 35.9 C (96.6 F)   TempSrc: Temporal   SpO2: 99%   Weight: 93.9 kg (207 lb)   Height: 1.651 m (5\' 5" )       General Appearance: no distress  Mental Status: Alert and oriented x 3  Mood/Affect: Mood  happy  Skin: no lesions noted and no edema  HEENT: Normocephalic, atraumatic.  Cardiovascular: normal rate, regular rhythm, normal S1, S2, no murmurs, rubs, clicks or gallops  Respiratory: clear to auscultation, no wheezes, rales or rhonchi, symmetric air entry  Abdomen: Soft, gravid, non-tender. No evidence of umbilical hernia.   Uterus: Gravid.  41 weeks size.  Non-tender to palpation.  Neurological: Normal, average response (2+)  Extremities: normal    External Genitalia: Unremarkable external genitalia without lesions.  Normal appearing labia majora/minora and introitus.  Vagina: Pink, ruggated vaginal mucosa without lesions. No discharge noted.  No bleeding noted.  Cervix: Cervix 3/40/ballotable, no lesions, no leakage of fluid.     Estimated Fetal Weight: 3600 grams by Thayer Ohm maneuvers    Presentation: cephalic by ultrasound    Placental location: fundal      Fetal Monitoring:  Baseline: 140  bpm  Variability: Moderate (6-25 BPM)  Accelerations: Yes 15X15  Decelerations: None  Category: category 1   Toco: contractions are variable      Assessment & Plan     Anita Patterson is a 27 y.o. G3P0020 at [redacted]w[redacted]d with pregnancy complicated by risks outlined above admitted for IOL for late term.     Admit to L&D   - Insert IV   - CBC, Type and Screen, and RPR sent on admission.    - 3/40/ballotable/ Membranes: intact    - Presentation: cephalic / EFW: 3600 g by Thayer Ohm maneuvers   - Category 1 fetal heart tracing. continuous EFM ordered.   - GBS negative   - Due to her previous history, Postpartum Hemorrhage Risk is low; plan is for Type and Screen    Induction of Labor:   - Plan to initiate labor with augmentation: misoprostol, pitocin 2x2, uptitration as needed.   - Labor analgesia: would like to hold off on epidural now, and will use only if necessary.    Postpartum Planning   - Infant: Expecting female infant.   - Feeding: breast   - PPBC: Not sure as of now   - Immunizations: TdAP UTD, Flu shot and MMR not up to date    D/w Dr. Ernestina Patches, DO/MPH  PGY1 FM resident

## 2022-03-25 NOTE — Progress Notes (Signed)
Report and transfer of care given to Maddy RN at this time.     Mirah Nevins K [place], RN

## 2022-03-25 NOTE — Progress Notes (Signed)
[facility] ATTENDING NOTE     In to room to greet patient and support people. She is very comfortable, occasional cramping but no obvious contractions.     BP 111/64   Pulse 93   Temp 35.9 C (96.6 F) (Temporal)   Resp 18   Ht 1.651 m (5\' 5" )   Wt 93.9 kg (207 lb)   SpO2 98%   BMI 34.45 kg/m     FHR cat I   Toco: irregular, Q6-10 min on toco     Cervix: to my exam, more like 2cm but could stretch to 3, 40-50% effaced and -3, tucked posteriorly behind baby's head     A/P: 27 yo G3P0020 at 64 and 2 admitted for late term IOL     Given cervix is still relatively unfavorable and thick, would recommend 1-2 more doses of Misoprostol to allow for better ripening/better Bishop score prior to starting pitocin.   She is in agreement, plan discussed with nursing and resident team.       Melina Fiddler, MD

## 2022-03-25 NOTE — Progress Notes (Signed)
In to room to check in on patient, she is becoming more uncomfortable, would like to discuss pain management options     BP 139/79   Pulse 84   Temp 36 C (96.8 F) (Axillary)   Resp 18   Ht 1.651 m (5\' 5" )   Wt 93.9 kg (207 lb)   SpO2 98%   BMI 34.45 kg/m     Cervix 4/70/-2 head more applied and has come down a bit in her pelvis     Reviewed pain management options, given primiparous status and still early in the induction/labor process, I think either Morphine and Phenergan or an epidural are reasonable options.     She would like to start with M&P for now, as she would like to be able to move around still     Plan to start pitocin soon now that 3rd dose of Miso is up and she is s/p SROM     All questions answered for her and her family      Melina Fiddler, MD

## 2022-03-25 NOTE — Progress Notes (Signed)
OBSTETRICS PROGRESS NOTE       Subjective     ITSP at beginning of shift. Patient is lying comfortably in bed. She states she feels occasional cramping but that the pain is very manageable at this time. She was seen here in triage yesterday for a headache and states her headache has not returned since. She denies change in vision, CP or SOB. She has not had any vaginal bleeding or LOF. She does not have any questions or concerns at this time.       Objective     Vitals:    03/25/22 0300 03/25/22 0600   BP: 133/84 104/85   Pulse: 90 93   Resp: 18 18   Temp: 35.9 C (96.6 F) 36 C (96.8 F)   TempSrc: Temporal Temporal   SpO2: 99% 98%   Weight: 93.9 kg (207 lb)    Height: 1.651 m (5\' 5" )        Most recent cervical exam:   OB Examiner: Raleigh Callas, MD with Barton Dubois, MD back check (03/25/22 0357)  Dilation: 3  Effacement (%): 40  Station: Ballotable    Membranes:   Membrane Status: Intact           Fetal Monitoring:  Baseline: 140 bpm  Variability: moderate  Accelerations: present  Decelerations: None  Category: I  Toco: q8-9 minutes      Assessment & Plan     Anita Patterson is a 27 y.o. G3P0020 at [redacted]w[redacted]d admitted for IOL for late term.    1. FHT: Category I. Interventions: repositioning and IVF.  2. Labor assessment: First dose of miso given @ 0428. Will continue with miso and switch to pitocin when appropriate.  3. Pain management: would like to hold off on epidural but will request one if she changes her mind.  4. GBS status: negative  5. Labor Risks:   - Late term     Abnormal 1 hr GTT  - Did not complete 3hr  - Has not been paneling sugars      Anxiety  - Not connected with BH   - Not on meds currently      Cigarette Smoker  - Smoking 3 cigarettes a day, sometimes 1-2 cigarettes a day depending on how stressed she is      Caralyn Guile, DO   Emergency Medicine, PGY-1

## 2022-03-25 NOTE — Anesthesia Preprocedure Evaluation (Signed)
Anesthesia Pre-operative History and Physical for Anita Patterson    Highlighted Issues for this Procedure:  27 y.o. female G3P0020 at [redacted]w[redacted]d requesting labor analgesia.   BMI Readings from Last 1 Encounters:  03/25/22 : 34.45 kg/m                .  .  Anesthesia Evaluation Information Source: patient, records     ANESTHESIA HISTORY  Pertinent(-):  No History of anesthetic complications or Family hx of anesthetic complications    GENERAL     Denies general issues  Pertinent (-):  No obesity, history of anesthetic complications or Family Hx of Anesthetic Complications    HEENT     Denies HEENT issues PULMONARY  Pertinent(-):  No smoking or asthma    CARDIOVASCULAR  Pertinent(-):  No hypertension    GI/HEPATIC/RENAL   NPO: Enter Last PO Intake in ROS/Med Hx Tab      + GERD          gestational NEURO/PSYCH/ORTHO     Denies neuro/psych/ortho issues  Pertinent(-):  No chronic pain, seizures or cerebrovascular event    ENDO/OTHER     Denies endo issues  Pertinent(-):  No diabetes mellitus    HEMATOLOGIC     Denies hematologic issues  Pertinent(-):  No coagulopathy or anticoagulants/antiplatelet medications         Physical Exam    Airway            Mouth opening: normal            Mallampati: I            Neck ROM: full            Airway Impression: easy  Dental   Normal Exam   Cardiovascular  Normal Exam           Rhythm: regular           Rate: normal      Neurologic    Normal Exam     Pulmonary   Normal Exam    breath sounds clear to auscultation    Mental Status   Normal Exam    oriented to person, place and time           ________________________________________________________________________  PLAN  ASA Score  2  Anesthetic Plan epidural      ; Line ( use current access); Pain (caudal/epidural)Standard Attestation    Informed Consent     Risks:          Risks discussed were commensurate with the plan listed above with the following specific points: N/V, hypotension, headache, failed block and infection, Damage to:  nerves.    Anesthetic Consent:         Anesthetic plan (and risks as noted above) were discussed with patient    Plan also discussed with team members including:       CRNA    Responsible Anesthesia Provider Attestation:  I attest that the patient or proxy understands and accepts the risks and benefits of the anesthesia plan. I also attest that I have personally performed a pre-anesthetic examination and evaluation, and prescribed the anesthetic plan for this particular location within 48 hours prior to the anesthetic as documented. Lillie Fragmin, MD  03/25/22, 11:27 PM

## 2022-03-25 NOTE — Progress Notes (Signed)
Verbal report and transfer of care given to Theodoro Doing, RN. EFM reviewed.    Almira Coaster, RN

## 2022-03-26 ENCOUNTER — Encounter: Payer: Self-pay | Admitting: Obstetrics and Gynecology

## 2022-03-26 MED ORDER — POLYETHYLENE GLYCOL 3350 PO PACK 17 GM *I*
17.0000 g | PACK | Freq: Every day | ORAL | Status: DC | PRN
Start: 2022-03-26 — End: 2022-03-27

## 2022-03-26 MED ORDER — OXYTOCIN 30 UNITS/500 ML NS *POSTPARTUM* *I*
125.0000 m[IU]/min | INTRAMUSCULAR | Status: AC
Start: 2022-03-26 — End: 2022-03-26
  Administered 2022-03-26: 125 m[IU]/min via INTRAVENOUS

## 2022-03-26 MED ORDER — ACETAMINOPHEN 325 MG PO TABS *I*
650.0000 mg | ORAL_TABLET | Freq: Four times a day (QID) | ORAL | Status: DC
Start: 2022-03-26 — End: 2022-03-27
  Administered 2022-03-26 – 2022-03-27 (×4): 650 mg via ORAL
  Filled 2022-03-26 (×4): qty 2

## 2022-03-26 MED ORDER — ONDANSETRON HCL 2 MG/ML IV SOLN *I*
4.0000 mg | Freq: Three times a day (TID) | INTRAMUSCULAR | Status: DC | PRN
Start: 2022-03-26 — End: 2022-03-27

## 2022-03-26 MED ORDER — WITCH HAZEL-GLYCERIN EX PADS *I* WRAPPED
MEDICATED_PAD | CUTANEOUS | Status: DC | PRN
Start: 2022-03-26 — End: 2022-03-27

## 2022-03-26 MED ORDER — CALCIUM CARBONATE ANTACID 500 MG PO CHEW *I*
1000.0000 mg | CHEWABLE_TABLET | Freq: Three times a day (TID) | ORAL | Status: DC | PRN
Start: 2022-03-26 — End: 2022-03-27

## 2022-03-26 MED ORDER — OXYTOCIN 30 UNITS IN 500ML NS WRAPPED *I*
1.0000 m[IU]/min | INTRAMUSCULAR | Status: DC
Start: 2022-03-26 — End: 2022-03-26
  Administered 2022-03-26: 18 m[IU]/min via INTRAVENOUS
  Administered 2022-03-26 (×2): 12 m[IU]/min via INTRAVENOUS
  Administered 2022-03-26 (×3): 4 m[IU]/min via INTRAVENOUS
  Administered 2022-03-26: 14 m[IU]/min via INTRAVENOUS
  Administered 2022-03-26: 2 m[IU]/min via INTRAVENOUS
  Administered 2022-03-26: 18 m[IU]/min via INTRAVENOUS
  Administered 2022-03-26: 16 m[IU]/min via INTRAVENOUS
  Administered 2022-03-26: 10 m[IU]/min via INTRAVENOUS
  Administered 2022-03-26: 14 m[IU]/min via INTRAVENOUS
  Administered 2022-03-26: 8 m[IU]/min via INTRAVENOUS
  Administered 2022-03-26: 6 m[IU]/min via INTRAVENOUS
  Administered 2022-03-26: 2 m[IU]/min via INTRAVENOUS
  Administered 2022-03-26: 12 m[IU]/min via INTRAVENOUS
  Administered 2022-03-26: 8 m[IU]/min via INTRAVENOUS
  Administered 2022-03-26: 10 m[IU]/min via INTRAVENOUS
  Administered 2022-03-26: 6 m[IU]/min via INTRAVENOUS

## 2022-03-26 MED ORDER — DIPHENHYDRAMINE HCL 25 MG ORAL SOLID *WRAPPED*
25.0000 mg | Freq: Every evening | ORAL | Status: DC | PRN
Start: 2022-03-26 — End: 2022-03-27

## 2022-03-26 MED ORDER — OXYTOCIN 30 UNITS/500ML NS BOLUS FROM BAG *I*
10.0000 [IU] | Freq: Once | INTRAMUSCULAR | Status: AC
Start: 2022-03-26 — End: 2022-03-26
  Administered 2022-03-26: 10 [IU] via INTRAVENOUS

## 2022-03-26 MED ORDER — IBUPROFEN 600 MG PO TABS *I*
600.0000 mg | ORAL_TABLET | Freq: Four times a day (QID) | ORAL | Status: DC
Start: 2022-03-26 — End: 2022-03-27
  Administered 2022-03-26 – 2022-03-27 (×4): 600 mg via ORAL
  Filled 2022-03-26 (×4): qty 1

## 2022-03-26 MED ORDER — OXYTOCIN 30 UNITS/500 ML NS *POSTPARTUM* *I*
95.0000 m[IU]/min | INTRAMUSCULAR | Status: AC
Start: 2022-03-26 — End: 2022-03-26
  Administered 2022-03-26: 95 m[IU]/min via INTRAVENOUS
  Filled 2022-03-26: qty 500

## 2022-03-26 MED ORDER — LACTATED RINGERS IV SOLN *I*
50.0000 mL/h | INTRAVENOUS | Status: AC
Start: 2022-03-26 — End: 2022-03-27
  Administered 2022-03-26 (×2): 50 mL/h via INTRAVENOUS

## 2022-03-26 NOTE — Progress Notes (Signed)
OBSTETRICS PROGRESS NOTE         Fetal Monitoring:  Baseline: 140 bpm  Variability: moderate  Accelerations: present  Decelerations: None  Category: 1  Toco: minimal ctx      Assessment & Plan     Anita Patterson is a 27 y.o. G3P0020 at [redacted]w[redacted]d admitted for IOL dt late term.    1. FHT: Category I. Interventions: reposition if needed.  2. Labor assessment: miso x3, pit  3. Pain management: M&P  4. GBS status: negative  5. Labor Risks: below    - Late term     Abnormal 1 hr GTT  - Did not complete 3hr  - Has not been paneling sugars      Anxiety  - Not connected with BH   - Not on meds currently      Cigarette Smoker  - Smoking 3 cigarettes a day, sometimes 1-2 cigarettes a day depending on how stressed she is      Hollace Kinnier, DO/MPH  PGY1 FM resident

## 2022-03-26 NOTE — Progress Notes (Addendum)
BRIEF OBSTETRICS PROGRESS NOTE     In to see patient for cervical exam per request. She is feeling a lot of increased pressure in her rectum.     Vitals:    03/26/22 0730 03/26/22 0745 03/26/22 0800 03/26/22 0830   BP: 105/61  112/71 122/77   Pulse: 83  64 74   Resp:       Temp:  36.2 C (97.2 F)     TempSrc:  Temporal     SpO2:       Weight:       Height:           Most recent cervical exam:   OB Examiner: [place], MD (03/26/22 0857)  Dilation: 7  Effacement (%): 90  Station: -1    Fetal Monitoring:  Baseline: 125 bpm  Variability: moderate to marked  Accelerations: present  Decelerations: Intermittent late/variable  Category: 2  Toco: q3-4 min     Plan: Continue pitocin, repositioning for variables    Glennie Isle, MD, MPH  Obstetrics & Gynecology, PGY-2      OB attending addendum:  I saw and evaluated the patient. I agree with the resident's/fellow's findings and plan of care as documented. In to see patient approximately 1 hour ago. She is still relatively comfortable but having some vaginal / rectal pressure.     O/:   Vitals:    03/26/22 1045   BP:    Pulse:    Resp:    Temp: 36.4 C (97.5 F)   Weight:    Height:    Lying in bed, comfortable appearing  FHT cat II for some variable decelerations.     IOL at 41 3/[redacted] weeks GA. Continue to titrate pitocin as able. If minimal change at last check or nursing cannot titrate pitocin, will place an IUPC. CEFM, following cat II tracing closely.        Cleon Dew, MD

## 2022-03-26 NOTE — Progress Notes (Signed)
Patient transferred from L&D to RM E319. Report received from RN. Oriented to unit, made aware of breakfast,menu given, call light with in reach, telephone explained, kitchenette located for pt. red badges made aware of. Plan for care for duration of stay made aware of. Patient assessed refer to flowsheet. Will continue to monitor.

## 2022-03-26 NOTE — Progress Notes (Signed)
OBSTETRICS PROGRESS NOTE       Subjective     In to see patient due to late deceleration on EFM. Pt comfortable with epidural. Reports intermittent vaginal pressure. Agreeable to cervical exam.        Objective     Vitals:    03/26/22 0018 03/26/22 0031 03/26/22 0100 03/26/22 0130   BP: 116/78 116/66 108/57 (!) 103/49   Pulse: 73 71 87 80   Resp:       Temp:   35.9 C (96.6 F)    TempSrc:   Temporal    SpO2:       Weight:       Height:           Most recent cervical exam:   OB Examiner: Tyler Deis, MD (03/26/22 0205)  Dilation: 6  Effacement (%): 90  Station: -1    Membranes:   Membrane Status: Spontaneous   Color: Clear   Rupture Date: 03/25/22  Rupture Time: 1325    Fetal Monitoring:  Baseline: 135 bpm  Variability: moderate  Accelerations: present  Decelerations: prolonged late > resolved with position change  Category: II, but overall reassuring with variability and accels  Toco: Ctx every 2-4 mins      Assessment & Plan     Anita Patterson is a 27 y.o. G3P0020 at [redacted]w[redacted]d admitted for late term IOL.    1. FHT: Category II. Interventions: maternal repositioning, cEFM > resolved to category I.  2. Labor assessment: progressing, s/p oral miso x3. Currently on pitocin.  3. Pain management: epidural in place.  4. GBS status: negative  5. Labor Risks:   - late term  - elevated 1h GTT, no 3h: admit BG wnl  - Anxiety, no meds  - tobacco use    Some progression in cervical change. Will continue pitocin titration as EFM and ctx pattern allow. Continue maternal repositioning PRN.     Selena Batten. Tyler Deis, MD  Family Medicine [facility] Fellow  Pager [phone]

## 2022-03-26 NOTE — Progress Notes (Signed)
Verbal report and transfer of care given to Darl Pikes, RN.  Monna Fam, rn

## 2022-03-26 NOTE — Progress Notes (Signed)
BRIEF OBSTETRICS PROGRESS NOTE     In to see patient for cervical exam per request.     Vitals:    03/26/22 0900 03/26/22 0930 03/26/22 0945 03/26/22 1000   BP: 109/67 114/64  117/71   Pulse: 76 72  66   Resp:       Temp:   37.1 C (98.8 F)    TempSrc:   Oral    SpO2:       Weight:       Height:           Most recent cervical exam:   OB Examiner: Stoklosa, MD (03/26/22 1038)  Dilation: ANT LIP  Effacement (%): 100  Station: 0  Position: ROA    Fetal Monitoring:  Baseline: 125 bpm  Variability: moderate to marked  Accelerations: present  Decelerations: Intermittent late  Category: 2  Toco: q2-3 min    Glennie Isle, MD, MPH  Obstetrics & Gynecology, PGY-2

## 2022-03-26 NOTE — Progress Notes (Addendum)
OBSTETRICS PROGRESS NOTE       Subjective     ITSP at shift change. Patient feeling comfortable this morning and isn't feeling any contractions with the epidural. She denies feeling pressure. She is currently laying on her side. She denies HA, change in vision, CP, SOB or RUQ pain. She has had some bloody show overnight as well. She has not questions or concerns at this time and and anxious for baby to arrive.        Objective     Vitals:    03/26/22 0530 03/26/22 0600 03/26/22 0630 03/26/22 0700   BP: 107/67 105/70 110/65 115/69   Pulse: 70 82 70 68   Resp:       Temp:  36 C (96.8 F)  36.2 C (97.2 F)   TempSrc:  Temporal  Temporal   SpO2:       Weight:       Height:           Most recent cervical exam:   OB Examiner: Osho, MD (03/26/22 0411)  Dilation: 6  Effacement (%): 90  Station: -1    Membranes:   Membrane Status: Spontaneous   Color: Clear   Rupture Date: 03/25/22  Rupture Time: 1325    Fetal Monitoring:  Baseline: 130 bpm  Variability: moderate  Accelerations: present  Decelerations: rare late decelerations  Category: II  Toco: q2-4 minutes      Assessment & Plan     Anita Patterson is a 27 y.o. G3P0020 at [redacted]w[redacted]d admitted for later term IOL.    1. FHT: Category II. Interventions: repositioning and IVF. Pitocin was stopped for a prolonged deceleration earlier in the night but was restarted at 0530  2. Labor assessment: s/p oral miso x3. Currently on pitocin.  3. Pain management: epidural in place.  4. GBS status: negative  5. Labor Risks:   - - late term  - elevated 1h GTT, no 3h: admit BG wnl  - Anxiety, no meds  - tobacco use      Caralyn Guile, DO   Emergency Medicine, PGY-1

## 2022-03-26 NOTE — Plan of Care (Signed)
Problem: Safety  Goal: Patient will remain free of falls  Outcome: Maintaining     Problem: Pain/Comfort  Goal: Patient's pain or discomfort is manageable  Outcome: Maintaining     Problem: Mobility  Goal: Patient's functional status is maintained or improved  Outcome: Maintaining     Problem: Psychosocial  Goal: Demonstrates ability to cope with illness  Outcome: Maintaining     Problem: Cognitive function  Goal: Lines and tethers will be removed as appropriate  Outcome: Maintaining  Goal: Vital signs will be within normal limits  Outcome: Maintaining  Goal: Ambulation and mobility will be maintained  Outcome: Maintaining  Goal: Retention of urine and constipation will be managed  Outcome: Maintaining     Problem: Vaginal Delivery - Recovery and Post Partum  Goal: Vital signs are medically acceptable  Outcome: Maintaining  Goal: Respiratory - Lungs clear to auscultation  Outcome: Maintaining  Goal: Cardiovascular - Homan's sign negative  Outcome: Maintaining  Goal: Patient will remain free of falls  Outcome: Maintaining  Goal: Fundus firm at midline  Outcome: Maintaining  Goal: Moderate rubra freeflow, no purulent discharge, no foul smelling lochia  Outcome: Maintaining  Goal: Empties bladder  Outcome: Maintaining  Goal: Verbalizes understanding of normal bowel function resumption  Outcome: Maintaining  Goal: Edema will be absent or minimal  Outcome: Maintaining  Goal: Breasts are soft with nipple integrity intact  Outcome: Maintaining  Goal: Demonstrates appropriate breast feeding techniques  Outcome: Maintaining  Goal: Appropriate behavior observed  Outcome: Maintaining  Goal: Positive Mother-Baby interactions are observed  Outcome: Maintaining  Goal: Perineum intact without discharge or hematoma  Outcome: Maintaining  Goal: Ambulates independently  Outcome: Maintaining     Problem: Pain  Goal: Patient's pain/discomfort is manageable  Outcome: Maintaining  Goal: Report decrease in pain level  Description:  Alleviation of pain or a reduction in pain to a level of comfort that is acceptable to the patient.  Outcome: Maintaining     Problem: Knowledge Deficit  Goal: Patient/S.O. demonstrates understanding of disease process, treatment plan, medications, and discharge instructions.  Outcome: Maintaining     Problem: Safety - OB  Goal: Follow unit's safety guidelines  Outcome: Maintaining

## 2022-03-26 NOTE — Progress Notes (Signed)
Brief Progress Note:     In room to assess patient in the setting of prolonged deceleration. Cervix rechecked and is unchanged from prior examination. Pulse oximetry and FSE applied. Will pause Pitocin due to deceleration. Patient repositioned to hands and knees with improvement of the tracing. Will allow for fetal recovery and reassess in 30 minutes if Pitocin can be restarted.         Gilman Buttner, MD   Obstetrics and Gynecology, PGY-3

## 2022-03-26 NOTE — Plan of Care (Signed)
Problem: Fetal Oxygenation  Goal: Maintain umbilical cord blood flow  Outcome: Completed or Resolved  Goal: Maintain adequate maternal oxygenation  Outcome: Completed or Resolved     Problem: Uteroplacental perfusion  Goal: Maintain adequate uteroplacental perfusion  Outcome: Completed or Resolved  Goal: Identify the presence of a labor contraction pattern  Outcome: Completed or Resolved  Goal: Reduce the risk of infection  Outcome: Completed or Resolved  Goal: Maintain patient safety in the presence of vaginal bleeding.  Outcome: Completed or Resolved     Problem: Fetal Oxygenation  Goal: Maximize maternal circulation during the intrapartum phase  Outcome: Completed or Resolved     Problem: Medicinal Interventions - Intrapartum  Goal: Safe and appropriate administration of maternal medications during the intrapartum period  Outcome: Completed or Resolved

## 2022-03-26 NOTE — Lactation Note (Signed)
This note was copied from a baby's chart.  Lactation Consultant Visit   Patient: Anita Patterson            AGE: 29 days              Corrected GA: 41w 3d  MRN: [id number]     Maternal Information   Birth Parent Name:   Anita Patterson    Type of Visit : Admission/Introduction (paged to room by RN)    Parent Feeding Goals  : Breastfeeding (From Delivery Summary)  Lactate for: Years (From Delivery Summary)     Number of Years: 1 (From Delivery Summary)      Right Breast Assessment : Soft  Left Breast Assessment : Soft    Right Nipple Assessment : Flat/ Minimally everted, Intact  Left Nipple Assessment : Flat/ Minimally everted, Intact  Right side nipple pain: 0  Left side nipple pain: 0     Newborn Assessment   Infant Growth:    Birth Weight: 3646 g (8 lb 0.6 oz)   Today's Wt: 3646 g (8 lb 0.6 oz) (Filed from Delivery Summary)   Percent Weight Change: 0 %      Feedings:   Last Charted feeding  Breast Milk Feeding  Route: Breast  Fed by: Parent(s)  Expressed Breast Milk Volume:  (a few large drops)  BF Devices: Nipple Shield (Comment) (xs)  BF Attempts (Ineffective at Breast): 1  BF Effectiveness: Attempt/Ineffective latch and/or suckle  Hand Expression: Prior to latch      Breastfeeding Assessment  Positioning: Cross Cradle, Right breast, Left breast  Suckle: Disinterested (sleepy)      Impression of Breast Feeding: Not Effective (Comment) (primary RN aware)      Interventions and Instructions   Met with parent at bedside in 303  Goal of LC Visit : Breastfeeding/Latch assessment, Provide emotional support    Assisted parent with latching and hand expression.    Answered all parents questions.    Reviewed: General Education Provided : Alternative feeding routes (finger/cup/spoon/syringe), Benefits of skin to skin, Breast/nipple care, Feeding Cues, Lactogensis/Supply & Demand, Hand expression, How to contact LC, Multiples feeding tips, Suck training, Tongue function and how it impacts BF  Latch Specific Teaching : Asymmetrical  latch on areola, Angle of mouth opening 140 degrees, Body position chest to chest, Bottom lip and tongue reach breast first, Bringing baby up to breast, not hunching, Head tilt, Lips flanged, Nipple not deformed after latch    Please see Lactation flowsheet for further details.    Plan   Follow-up with lactation daily while dyad inpatient or by parental request.    Feeding Plan:   Recommended Feeding Plan: Ad lib breastfeeding, Strongly recommend hand expression of extra MBM after BF and offer all to baby, also every hour give colostrum until baby eats well on breast

## 2022-03-26 NOTE — Progress Notes (Signed)
Patient's vitals and bleeding stable. Patient's vitals and bleeding stable. Handoff and transfer of care given to [place], [place]

## 2022-03-27 LAB — CBC
Hematocrit: 28 % — ABNORMAL LOW (ref 34–49)
Hemoglobin: 9.3 g/dL — ABNORMAL LOW (ref 11.2–16.0)
MCH: 29 pg (ref 26–32)
MCHC: 33 g/dL (ref 32–36)
MCV: 87 fL (ref 75–100)
Platelets: 237 10*3/uL (ref 150–450)
RBC: 3.2 MIL/uL — ABNORMAL LOW (ref 4.0–5.5)
RDW: 14.6 % (ref 0.0–15.0)
WBC: 13.3 10*3/uL — ABNORMAL HIGH (ref 3.5–11.0)

## 2022-03-27 MED ORDER — BENZOCAINE-MENTHOL 20-0.5 % EX AERO *I*
INHALATION_SPRAY | CUTANEOUS | Status: DC | PRN
Start: 2022-03-27 — End: 2022-03-27
  Filled 2022-03-27: qty 78

## 2022-03-27 NOTE — Progress Notes (Signed)
Social Work  Contacts:  Pharmacist, community, FOB in room as is baby  Summary: Anita Patterson is a 27 yo coupled woman who delivered her first baby.  She says she does have a history of anxiety which she says she manages on her own, does not take meds or receive therapy.  SW reviewed perinatal mood and anxiety disorders signs and symptoms and provided a written checklist to be used as a tool to monitor mood.  SW dicussed post partum blues and more concerning perinatal mental health issues.  SW provided written resources and information on perinatal mental health. SW encouraged [place] to contact he provider with any issues or concerns.  Plan:  No further hospital social work needs currently indicated.  Glenna Fellows, LCSWR

## 2022-03-27 NOTE — Discharge Instructions (Signed)
[facility]   [address]   [place], [place] [address] [phone]                                                      POSTPARTUM  INSTRUCTIONS  General Activity:  On the day of discharge, go home and rest.  Minimize visitors the first few weeks, so you can rest.  Increase activity such as walking and stair climbing gradually.  No strenuous activity or heavy lifting should be done for several weeks.  Do not drive for a week after your delivery.  It is easy to have an accident when you are tired. Travel is fine after 2 weeks, but if on a long trip get out and walk every 2 hours to maintain circulation in order to prevent blood clots  Often your feet and ankles will become swollen during the week after delivery.  If you had IV fluids, you may swell for several weeks.      Employment:  You may return to work 6 weeks after delivery.  This is [place].    Stitches and or Hemorrhoids:  Keep the perineal are clean with baths, showers or Sitz baths.  Benzocaine or similar spray/ointment is available over the counter and can be applied as needed to stitches and hemorrhoids.  Preparation H, Anusol and Tucks are also fine for hemorrhoids.    Breast Care:  If you are bottle feeding, a snug bra will be helpful in preventing engorgement.  If engorgement does occur, apply ice packs to breasts and medicate with Ibuprofen.  This must be done continuously for several days.  Medication is no longer used to "dry up" your breast milk.      Breast feeding takes patience and perseverance.  Remember, this is a new learning experience for you and your baby.  For the first few weeks, your baby may nurse every 2-3 hours.  Good nutrition, fluids and rest are important for your own well being, so try to rest as much as possible in between feedings.  If the baby doesn't empty your breasts and they become engorged, you may express the rest of the milk.  Nipple tenderness or  soreness, peaks at 5-7 days of nursing.  To correct this, check the areola (pigmented area around breasts), not just the nipple.  Letting a little breast milk dry on the exposed nipple and applying ice packs to nipples, helps healing.  Lanolin creams or ointments and Soft Shields also help.  Clogged ducts (feel like stringy clumps) can be relieved with massage and a warm shower or wash cloth on the breast prior to nursing or pumping.  Breast infections can occur whether you are nursing or not.  Symptoms include fever, chills, painful area on your breast and flu-like symptoms.  Usually antibiotics are needed, so call us if there are any concerns for mastitis infection. If you need to use medications other than what we list at the end of these instructions, check with your pediatrician's office.      Abdominal Cramps:  "After birth pains" can be severe, especially with nursing and after the birth of the second or more child.  They rarely last more than 4 days.  See Pain Medications list if you need something for your cramps.        Vitamins and Iron:  Continue for 1 month if not nursing.  Continue the vitamins throughout nursing.  Continue iron for 1 month.    Bathing:  Showers and baths are fine.  No douching.     Constipation:  Stool softeners such as Colace, Metamucil, Senokot or generic are fine to use.  Use milk of magnesia , as necessary.  No prescription is needed for any of these.      Menstruation:  Bleeding is expected for several weeks and up to 12 weeks is not abnormal.  It may be red, blackish with clots.  It changes to pink and then yellow-gray.  Frequently, the discharge becomes red again.  If the bleeding gets heavy again, you need to rest more.  The return of a real menstrual period varies.  For non-nursing mothers, it is usually between 4-10 weeks.  For nursing mothers, it can take over a year.  Several months of periods are usually required before cycles become regular.  Bleeding may increase after  you go home, due to increased activities.      Sexual Intercourse:  It takes varying times for lacerations, hemorrhoids and swelling to go away so that you are comfortable. Two weeks is usually the minimum wait.   We highly recommend waiting until you are seen in the office for your postpartum exam prior to returning to sexual activity. Nursing causes vaginal dryness and lubrication is often required.  Remember, the possibility of becoming pregnant exists, even while nursing.  You ovulate 2 weeks before the return of the first period, so you may become pregnant before your first period.      Contraception:  If you are breast feeding, you may start progesterone-only contraceptive pills shortly after delivery, or after your office visit.  Depo Provera, IUDs or barrier forms of birth control may also be used while nursing.  Before you are seen, use condoms.  We are happy to discuss contraception at your postpartum visit if you wish.     Postpartum Examination:  Call our office to schedule your appointment.  Usually it is best to see the doctor who delivered your baby so that questions or issues regarding your delivery can be answered.  This visit will be scheduled 6 weeks after you delivered.  After that, you may wish to return to your usual care provider.      Call if the following occur:      1. Fever of 101 degrees or severe chills.   2. Excessively heavy bleeding or "flooding".   3. Extreme urinary frequency and burning with urination.   4. Swelling or tenderness in one area of the breast.   5. Marked depression or anxiety.    Check your temperature before calling, if possible.  You may feel hot but you cannot determine "fever" without a thermometer.  For non emergencies, messages may be left on our tape after hours or on weekends and we will return your call the next business day.  For acute emergencies, call [phone].  Please have your pharmacy number ready when you call.  Our office hours are Monday through  Friday, 8:30 a.m. - 4:30 p.m.      Pain Medication:  Ibuprofen or Acetaminophen are safe to take while nursing.   In closing,   You will experience many physical and emotional changes during this time.  Rest when you can and be good to yourself.  Let us know if we can help in any way.            The doctors, nurses and staff of [facility]

## 2022-03-27 NOTE — Progress Notes (Signed)
OBSTETRICS VAGINAL DELIVERY PROGRESS NOTE   Postpartum Day: 1    Subjective     Anita Patterson is doing well.  Pain is well-controlled with current regimen.  Tolerating regular diet without nausea/vomiting.  She has ambulated.  Denies chest pain, SOB, or lightheadedness.  Appropriate vaginal bleeding.  Baby is dong well, breastfeeding well.      Objective     Vitals:    03/26/22 1445 03/26/22 1704 03/26/22 1930 03/27/22 0041   BP: 112/71 111/67 122/76 119/62   Pulse: 62 68 72 89   Resp:  18 17 19    Temp: 36.9 C (98.4 F) 36.5 C (97.7 F) 36.7 C (98.1 F) 36.6 C (97.9 F)   TempSrc: Oral Infrared Infrared Infrared   SpO2:  96% 98% 98%   Weight:       Height:           Mental Status: Alert and oriented x 3  Cardiovascular: Regular rate and rhythm with no murmurs  Respiratory: Clear to auscultate  Abdomen: Soft, appropriately tender, nondistended, +BS  Fundus: Fundus firm at umbilicus  Neurological: Not assessed  Extremities/Skin: Marked of lower extremities (2+)      Labs     Recent Labs   Lab 03/27/22  0511 03/25/22  0346 03/24/22  1200   Hematocrit 28* 34 36       Rubella IgG AB (no units)   Date Value   12/26/2021 POSITIVE       ABO RH Blood Type (no units)   Date Value   03/25/2022 O RH POS           Information for the patient's newborn:  Kartina, Sweetman Girl [[id number]]     Newborn ABO RH   Date Value Ref Range Status   03/26/2022 A RH POS  Final          Assessment & Plan     Anita Patterson is a 27 y.o. [id number] on PPD# 1 s/p Vaginal, Spontaneous  at [redacted]w[redacted]d complicated by late term and anxiety.  Doing well.    Routine postpartum care:  - Rh status as above. Rhogam is not indicated.    - Infant: female, Monse  - Feeding: breast  - PPBC:  uncertain  - PPHct as above. Ferrous sulfate is indicated on discharge.  - Immunizations: MMR not up to date     Anxiety  - Not connected with BH   - Not on meds currently      Cigarette Smoker  - Smoking 3 cigarettes a day, sometimes 1-2 cigarettes a day depending on how stressed she  is    Disposition: Plan for D/C home PPD # 2.    Nicholaus Bloom, MD, PhD  Family Medicine Resident, PGY-2  03/27/22  6:49 AM

## 2022-03-27 NOTE — Lactation Note (Signed)
This note was copied from a baby's chart.   Lactation Consultant Discharge Visit     See lactation flowsheets   Currently Pumping: encouraged to start pumping after every feeding for 15" and hand expressing then give to baby via spoon or suck training with oral syringe before or after breastfeeding until mature milk in and as guided by pediatrician  Comments: Breast feeding teaching, progression and support reviewed    Newborn Assessment    Newborn Name: Anita Patterson Sex: female GA: 19 3/7   Pediatrician: Gerome Apley, MD  Ped Phone: [phone]  Infant Weight: Birth Weight: 3646 g (8 lb 0.6 oz)   Today's Wt: 3422 g (7 lb 8.7 oz)   Percent Weight Loss: -6.15 %  F/V/S 24 hours:   Date 03/26/22 0700 - 03/27/22 0659 03/27/22 0700 - 03/28/22 0659   Shift 0981-1914 1500-2259 2300-0659 24 Hour Total 0700-1459 1500-2259 2300-0659 24 Hour Total   INTAKE   Breast Feeding             BF Occurrence (Effective at Breast)  2 x 2 x 4 x 1 x   1 x     Minutes of Active Sucking  17 min  17 min         BF Attempts (Ineffective at Breast) 1 x   1 x       Shift Total(mL/kg)           OUTPUT   Urine(mL/kg/hr)             Urine Occurrence  1 x  1 x 2 x   2 x   Stool             Stool Occurrence 3 x 3 x 1 x 7 x 1 x   1 x   Shift Total(mL/kg)           NET           Weight (kg) 3.6 3.6 3.6 3.6 3.4 3.4 3.4 3.4      Any formula supplementation during hospital stay:  no  Comments:  Baby's feeding, voids and stools are all within normal limits.     Information handouts given during hospital stay:  Breastfeeding Guide and Feeding Log  Engorgement  Latch  Hand Expression  Nipple Shield  Pacifiers  [email or web address]    Teaching    Breast/Nipple care, Positioning, Areolar expression, Correct attachment, Baby feeding cues, Signs of effective suckling/letdown, Signs of adequate infant intake (# of wet/stool diaper per 24 hrs), Suck Training, Engorgment Management, Resources for [employer], Pumping/Storage, Introduction of  bottles/pacifiers, and Cluster Feeding  Newborn behaviors, skin to skin, best start   Plan   Follow up: phone call by Lactation office and parents to call as needed.  Follow up Peds: 1 day

## 2022-03-27 NOTE — Anesthesia Postprocedure Evaluation (Signed)
Anesthesia Post-Op Note    Patient: Anita Patterson    Procedure(s) Performed:  Procedure Summary  Date:  03/25/2022 Anesthesia Start: 03/25/2022 11:28 PM Anesthesia Stop: 03/26/2022 12:48 PM Room / Location:  * No operating room entered * / HH ANESTHESIA AD HOC   * No procedures listed * Diagnosis:  * No pre-op diagnosis entered * * No surgeons listed * Responsible Anesthesia Provider:  Lillie Fragmin, MD         Recovery Vitals  BP: 119/62 (03/27/2022 12:41 AM)  Heart Rate: 89 (03/27/2022 12:41 AM)  Resp: 19 (03/27/2022 12:41 AM)  Temp: 36.6 C (97.9 F) (03/27/2022 12:41 AM)  SpO2: 98 % (03/27/2022 12:41 AM)   0-10 Scale: 1 (03/27/2022  3:54 AM)    Anesthesia type:  epidural  Complications Noted During Procedure or in PACU:  No value filed.   Comment: No value filed.  Patient Location:  Labor and Delivery  Level of Consciousness:    Recovered to baseline  Patient Participation:     Able to participate  Temperature Status:    Normothermic  Oxygen Saturation:    Within patient's normal range  Cardiac Status:   within patient's normal range  Fluid Status:    Stable  Airway Patency:     Yes  Pulmonary Status:    Baseline  Neuraxial Block Evaluation:    No residual motor or sensory symptoms  Pain Management:    Adequate analgesia  Nausea and Vomiting:  None    Post Op Assessment:    Tolerated procedure well  Responsible Anesthesia Provider Attestation:  All indicated post anesthesia care provided         Complications Noted During Recovery Period:      None  Recovery from Anesthesia:      Recovered to baseline level of consciousness  Condition of patient:      Recovered to pre-anesthetic condition

## 2022-03-27 NOTE — Progress Notes (Signed)
Patient discharged to home. AVS reviewed. She verbalized understanding of discharge instructions. She knows when to follow up with OB.  French Ana, RN

## 2022-04-05 ENCOUNTER — Telehealth: Payer: Self-pay

## 2022-04-05 NOTE — Telephone Encounter (Signed)
Breastfeeding ( appt)    Primary concern:  Stopped breast feeding due to baby tongue tie she is supplementing and also pumping 4-6 times a day an average output off 30oz offered patient next available with provider patient accepted 04/12/22 1:30pm    Mom's prenatal care Provider/OBgyn:      [facility]   Name/MRN of Baby: Monse Gabriel Rung [id number]  Baby's Provider/Pediatrician: MD Lisbeth Renshaw

## 2022-04-05 NOTE — Telephone Encounter (Signed)
Reached out to triage   She will keep Wed Appt but was offered a sooner appt on Tuesday.    Is being referred by lactation at [facility] peds   Have worked on latch and pumping with her.  Tongue is tied to the tip   Unable to breastfeed unless using a shield   Little milk transfer according to post weight   Is not BF now due to TT   Pumping is under control at this point and has no issues   Would like to combo feed breast and bottle    Discussed :  Expectations for day of appt   Bring baby ready for latchwork if desired  and pump   Expose baby to nipple / breast at least for comfort if able and no pain  Use shield ok  , after a bottle   Will help with eventual goal of breastfeeding

## 2022-04-11 NOTE — Progress Notes (Unsigned)
Lactation Consultant - Outpatient Note,  New Patient Visit    Patient referred for Breastfeeding Medicine visit by: ***  Mom's prenatal care provider/OBGYN: ***   Baby's provider: ***  Parent gives consent for the consult notes and information to be shared with their own provider, and baby's, provider(s).    Chief Complaint:     No chief complaint on file.      Birth History: G***P***, TYPE OF DELIVERY: {NICU Delivery [phone]} at *** weeks, Complications {None/Yes Child Psych:22658}    HPI:  Parent reports that ***    Feeds at breast: ***  Feeds from bottle: ***  Pumping: ***/day; *** pump ;  comfortable ***,  size flanges ***; settings: ***; output ***  Formula use: ***  Breastmilk stored: ***      MOM'S GOAL: {Gen peds BF Moms goals:25872}    ____________________________________________________________    Relevant Maternal/Parent Information    Maternal PMH:***  Maternal PSH: ***    Breast or Chest surgeries or Trauma: ***  Past Breastfeeding Experiences: ***  Breast changes in pregnancy: {yes no:16019492::"No"}    Mother ROS: ***, maternal mood          No data to display                Maternal Meds: ***    Physical Assessment:  Breast assessment:{BREAST ASSESS:30416247} ***    Nipple assessment: {NIPPLE ASSESS:30416244} ***    Relevant Infant/Child Information    Infant PMH:***    Did your baby receive Vitamin K injection?    Baby ROS:  ***, infant has age-appropriate sleep, infant has age-appropriate behavior    Infant Meds: ***    Physical Assessment:  Infant oral assessment:     Palate: ***    See below for tongue functional assessment:                ____________________________________________________________    Windell Moment assessment:   {Gen Peds BF Latch Observed:25871}    Latch teaching done:   {Gen Peds BF Latch Observed Teaching:26257}    Relevant Family History: ***    Social History: dyad lives with ***, supports at home ***,  Is anyone hurt, hitting, or threatening mother or baby? (pushing,  shoving) *** , smoke exposure or vape ?{yes no:16019492::"No"}, where does baby sleep? ***    Maternity Leave Plans: ***    SUMMARY: ***      ____________________________________________________________    RECOMMENDATIONS:       {Duration:2107591} spent with patient, face-to-face with Lactation Consultant specifically  ***please be sure this agrees with your billing/coding, then delete this phrase***    [place]

## 2022-04-12 ENCOUNTER — Ambulatory Visit: Payer: PRIVATE HEALTH INSURANCE | Admitting: Obstetrics and Gynecology

## 2022-04-12 ENCOUNTER — Other Ambulatory Visit: Payer: Self-pay

## 2022-04-12 DIAGNOSIS — Z9189 Other specified personal risk factors, not elsewhere classified: Secondary | ICD-10-CM

## 2022-04-12 MED ORDER — ELECTRIC BREAST PUMP (FOR PERSONAL USE) *A*
0 refills | Status: AC
Start: 2022-04-12 — End: ?

## 2022-04-12 NOTE — Patient Instructions (Addendum)
Spectra S1 and S2 settings:  1- Push wavy button first for Stimulation mode. 70 cycles- vacuum levels are 1-5.    adjust the vacuum to what is best for you.  2- After let down push wavy button again for Expression phase.    Cycles ( frequency ) vary from 54 down to 38. You adjust those to fit you.   The suction level in this expression phase is from 1-12.     Some parents prefer higher cycles and less vacuum OR the opposite lower cycles and more vacuum.  Choose the settings that you are most comfortable with and give you the most milk.   It should not be painful. Turn down the vacuum if it is.    Helpful guide to Spectra use and settings:  https://livingwithlowmilksupply.com/how-to-use-spectra-s1-breast-pump            Use Best pump mechanics:     BEFORE:  -check flange fit,  good image here: http://blog.medelabreastfeedingus.com/2015/05/medela-breastshield-sizing-guide/  -breast massage beforehand: www.bfmedneo.com    DURING:  -relaxation, deep breathing, mindset  - pressure of  pump to 2/3-3/4 of the dial, without pain  -hand expression with pump flanges on and working towards the end of the sesssion    AFTER:  -For ease of timing, refrigerate pump parts all put together in a ziplock  in between uses. Then, wash in warm, soapy water and air dry once a day.    PLANNING:  -at least 6-8 times per day- 15-20 mins with 19 inserts  -not more than 6h overnight      Tips to increase milk production  You can also use another let down 1/2 way throughout the pumping session.  Increase the suction to the highest strength without causing pain.  Hand express  Gentle breast massage with pumping & breastfeeding  Try to not go more than one 6 hour stretch without stimulation in 24 hours  Consider renting a symphony pump.  Foods     There are foods that some parents eat to try to increase breast milk though there is not evidence to show that they increase milk production.    -oatmeal  -flax seed  -chia seeds  -Brewer's yeast  -Body  Armour/coconut water  -From [place]: fish soup   -From [place]: "J[place]" - 1L milk, 1c rolled oats, 1c sugar, 3 limes juiced (consider decreasing sugar content)   -From [place] or [place]: Fenugreek in cooking  -From [place] and [place] - Moringa(M[place]ung[place]) in cooking       Also make sure you are having enough calories, you burn 500 more calories a day making milk/breastfeeding.   Also try to stay hydrated with water, the goal is to have 1/2 of your body weight in ounces per day for good hydration.       Latch- Continue to work on Theme park manager as demonstrated today.      *Use these tips for Football and cross cradle holds.   Support baby's neck/ back  Turn tummy to tummy  Compress breast from underneath in shape of a U  Line up nipple to nose  Stroke nipple to top of lip/ nose and wait for a wide gape  Bring baby in close quickly before the mouth closes  Have cheeks/ nose close and touching the breast during the feed.  Use a rolled up blanket under your hand for support  Laid Back hold at breast can be a treatment for some feeding difficulties ( fast let down , gulping/swallowing air, reflux, sore nipples )     1- You will need a pillow for your back   2- Lie back far enough so baby will be on your belly and chest but not all the way flat, ideally angle is about 15- 65 degrees  3- Put baby on your chest and allow time to latch , head will bob around , baby will knead the breast before finally latching and you can gently guide the breast if needed  4- Support baby and body, let baby's feet be able to push against something ( like your lap or a pillow) , allows for positional stability   5- Your arms may need a pillow and you should be able to relax and still feel baby being supported                     From USDA:  IS IT SAFE TO SMOKE TOBACCO CIGARETTES WHILE BREASTFEEDING?  If you smoke, it is best for you and your baby if you quit as soon as possible. Smoking can  cause low milk supply, colic, and milk let-down issues.    If you do continue to smoke, you should still breastfeed. Your milk can protect your baby from breathing problems, sudden infant death (SIDS), and poor weight gain. Wait as much time as possible between smoking and breastfeeding. This will lower the amount of nicotine in your milk while nursing.    Be sure to smoke away from your baby and change your clothes to keep your baby away from the chemicals smoking leaves behind. Other people smoking around your baby (secondhand smoke) can also harm your baby's health.    Electronic cigarettes, also known as "e-cigarettes," "e-cigs," "vapes," or "vape pens," may also harm your baby's health. These devices deliver nicotine, flavorings, and other additives through an inhaled aerosol. There is limited research about the safety and health effects of e-cigarettes.    Ask your doctor for advice on quitting smoking.

## 2022-04-12 NOTE — Progress Notes (Signed)
BREASTFEEDING & LACTATION MEDICINE PROVIDER NOTE    Chief Complaint:     New Patient Visit (Breastfeeding problem)    HPI SEE LC NOTE, additional details include:  Parents here w/infant for TT eval.  Peds at [facility] mentioned TT at discharge. Had to use nipple shield once home - worked w/LC at [employer] - noted TT also. No milk transfer noted - wt down 10% - switched to pump/bottle at that time.   Supply is currently meeting baby's intake need. Hopes to avoid formula.  Pumps 6-7x/day w/[employer] - no one mentioned insurance pump.   Mom does not have much appetite.  Would like to breast and bottle feed.    With Lactation Consultant: declined latch eval today.     Mother PMH/PSH/Meds/Allergies/ROS reviewed from chart and [place] note. Pertinent additions include: p1 del 41+3wks SVD, IOL for late term. Anxiety, cigarette smoker.     maternal mood       04/12/2022     1:42 PM   PHQ   PHQ Total Score 0       Current Outpatient Medications   Medication Sig    Prenatal MV-Min-Fe Fum-FA-DHA (PRENATAL 1 PO) Take by mouth (Patient not taking: Reported on 04/12/2022)     Mother PE:   BP 137/83 (BP Location: Right arm, Patient Position: Sitting)   Pulse 96   Temp 36.2 C (97.1 F)      GENERAL: Well-appearing  BREAST: deferred today - see LC note for pump fit/eval    ASSESSMENT: 27 y.o. lactating mother of 34 day old infant with latch difficulty, currently bottle feeding but mother would like to breastfeed. 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 Parents desire frenotomy for infant today. Mother with adequate milk production currently.    MOM'S GOAL: Any breastfeeding    PLAN components:  - support given   - discussed infant's oral assessment and management options. Parents chose frenotomy today after discussion of R/B.   - helped mother obtain breast pump today from insurance, pump review/fit w/LC today  - discussed latching, pump/bottle  - maternal self care  discussed  - return in 1 week, can work on latching now or on FU visit.    Writer to continue to be available to mom for breastfeeding support as needed or by referral    Anita Patterson, CNM, M.S.,MPH, IBCLC, [place]  Breastfeeding & Lactation Medicine   [phone]

## 2022-04-19 NOTE — Progress Notes (Deleted)
Lactation Consultant -  Follow Up Visit    Parent gives their consent for the consult notes and information to be shared with their own, and baby's, provider(s).    Chief Complaint:     No chief complaint on file.  Fren 2/28 , MB/ JA, no latch , + pump fit     HPI:  Parent reports that ***    Feeds at breast: ***  Feeds from bottle: ***  Pumping: ***/day; *** pump ;  comfortable ***,  size flanges ***; settings: ***; output ***  Formula use: ***/day  Breastmilk stored: ***    Prev  Feeds at breast:  none  Feeds from bottle: q3-4 hrs ATC (2-2.5 oz)  They are paced bottle feeding- this has helped with bottle issues.    Pacificer clicking     Pumping: 6-7/day; Mom [employer] wearable (bought herself), has no other pump or order in chart;  comfortable Y,  size flanges 24- sized as a 22; settings: Stim mode, L3, then increase.  ; output 2-3 oz/combined   Using nipple butter to lubricate  Yesterday started  30 min sessions, stops to store, then start again 10 min later  for another 15 mins.   Formula use: none, a couple bottles the first few days until milk transitioned.    MOM'S GOAL: {Gen peds BF Moms goals:25872}    ____________________________________________________________    Relevant Maternal/Parent Information  Mother ROS: ***, maternal mood         04/12/2022     1:42 PM   PHQ   PHQ Total Score 0   ; ***    Changes in Maternal PMH/Meds:***    Physical Assessment:  Breast assessment:{BREAST ASSESS:30416247} ***    Nipple assessment: {NIPPLE ASSESS:30416244} ***    Relevant Infant/Child Information  Baby ROS:  ***, infant has age-appropriate sleep, infant has age-appropriate behavior    Changes in Infant/Child PMH/Meds: ***    Physical Assessment:  Infant oral assessment:     Palate: ***    See below for tongue functional assessment:                  ____________________________________________________________    Windell Moment assessment:   {Gen Peds BF Latch Observed:25871}    Latch teaching done:   {Gen Peds BF Latch Observed  Teaching:26257}    SUMMARY: ***      ____________________________________________________________    RECOMMENDATIONS:       {Duration:2107591} spent with patient, face-to-face with Lactation Consultant specifically  ***please be sure this agrees with your billing/coding, then delete this phrase***    Nelwyn Salisbury, RN

## 2022-04-20 ENCOUNTER — Ambulatory Visit: Payer: PRIVATE HEALTH INSURANCE | Admitting: Obstetrics and Gynecology

## 2022-08-22 ENCOUNTER — Ambulatory Visit: Payer: Medicaid Other

## 2022-09-05 ENCOUNTER — Emergency Department
Admission: EM | Admit: 2022-09-05 | Discharge: 2022-09-05 | Disposition: A | Discharge status: Home / Self Care | Payer: Medicaid Other | Attending: Emergency Medicine | Admitting: Emergency Medicine

## 2022-09-05 ENCOUNTER — Other Ambulatory Visit: Payer: Self-pay

## 2022-09-05 ENCOUNTER — Emergency Department: Payer: Medicaid Other

## 2022-09-05 DIAGNOSIS — U071 COVID-19: Secondary | ICD-10-CM | POA: Insufficient documentation

## 2022-09-05 DIAGNOSIS — R059 Cough, unspecified: Secondary | ICD-10-CM | POA: Diagnosis present

## 2022-09-05 DIAGNOSIS — B349 Viral infection, unspecified: Secondary | ICD-10-CM

## 2022-09-05 LAB — RESP PANEL BY RT-PCR (RSV, FLU A&B, COVID)  RVPGX2
Influenza A by PCR: NEGATIVE
Influenza B by PCR: NEGATIVE
Resp Syncytial Virus by PCR: NEGATIVE
SARS Coronavirus 2 by RT PCR: POSITIVE — AB

## 2022-09-05 MED ORDER — ALBUTEROL SULFATE HFA 108 (90 BASE) MCG/ACT IN AERS: 2.0000 | INHALATION_SPRAY | RESPIRATORY_TRACT | 0 refills | Status: AC | PRN

## 2022-09-05 MED ORDER — BENZONATATE 100 MG PO CAPS: 100.0000 mg | ORAL_CAPSULE | Freq: Three times a day (TID) | ORAL | 0 refills | Status: AC | PRN

## 2022-09-05 MED ORDER — PREDNISONE 20 MG PO TABS
60.0000 mg | ORAL_TABLET | Freq: Once | ORAL | Status: AC
  Administered 2022-09-05: 60 mg via ORAL
  Filled 2022-09-05: qty 3

## 2022-09-05 MED ORDER — ALBUTEROL SULFATE (2.5 MG/3ML) 0.083% IN NEBU
2.5000 mg | INHALATION_SOLUTION | Freq: Once | RESPIRATORY_TRACT | Status: AC
  Administered 2022-09-05: 2.5 mg via RESPIRATORY_TRACT
  Filled 2022-09-05: qty 3

## 2022-09-05 MED ORDER — PREDNISONE 50 MG PO TABS: 50.0000 mg | ORAL_TABLET | Freq: Every day | ORAL | 0 refills | Status: AC

## 2022-09-05 MED ORDER — PSEUDOEPH-BROMPHEN-DM 30-2-10 MG/5ML PO SYRP: 10.0000 mL | ORAL_SOLUTION | Freq: Four times a day (QID) | ORAL | 0 refills | Status: AC | PRN

## 2022-09-05 MED ORDER — ACETAMINOPHEN 325 MG PO TABS
650.0000 mg | ORAL_TABLET | Freq: Once | ORAL | Status: AC
  Administered 2022-09-05: 650 mg via ORAL
  Filled 2022-09-05: qty 2

## 2022-09-05 NOTE — ED Triage Notes (Signed)
Pt reports known exposure to COVID. Reports cough, congestion, fatigue, body aches, subjective fever. Reports cp with coughing. Denies sputum production. Pt ambulatory to triage. Alert and oriented following commands. Breathing unlabored speaking in full sentences with symmetric chest rise and fall.

## 2022-09-05 NOTE — ED Provider Notes (Signed)
[facility] Provider Note  Patient Contact: 10:29 PM (approximate)   History   flu symptoms    HPI  Lauren Cunningham is a 27 y.o. female who presents emergency department bodyaches, headache, cough.  Patient has a hacking dry cough.  Patient with no reported congestion or sore throat.  She was around an individual who tested positive for COVID.  Patient is now symptomatic for 2 days.     Physical Exam   Triage Vital Signs: ED Triage Vitals  Encounter Vitals Group     BP 09/05/22 2156 125/75     Systolic BP Percentile --      Diastolic BP Percentile --      Pulse Rate 09/05/22 2156 (!) 102     Resp 09/05/22 2156 18     Temp 09/05/22 2156 100.1 F (37.8 C)     Temp Source 09/05/22 2156 Oral     SpO2 09/05/22 2156 100 %     Weight 09/05/22 2155 185 lb (83.9 kg)     Height 09/05/22 2155 5\' 3"  (1.6 m)     Head Circumference --      Peak Flow --      Pain Score 09/05/22 2155 7     Pain Loc --      Pain Education --      Exclude from Growth Chart --     Most recent vital signs: Vitals:   09/05/22 2156  BP: 125/75  Pulse: (!) 102  Resp: 18  Temp: 100.1 F (37.8 C)  SpO2: 100%     General: Alert and in no acute distress. ENT:      Ears:       Nose: No congestion/rhinnorhea.      Mouth/Throat: Mucous membranes are moist. Neck: No stridor. No cervical spine tenderness to palpation  Cardiovascular:  Good peripheral perfusion Respiratory: Normal respiratory effort without tachypnea or retractions. Lungs with slight wheezing in the right side.  No rales or rhonchi.Peri [place] air entry to the bases with no decreased or absent breath sounds. Musculoskeletal: Full range of motion to all extremities.  Neurologic:  No gross focal neurologic deficits are appreciated.  Skin:   No rash noted Other:   ED Results / Procedures / Treatments   Labs (all labs ordered are listed, but only abnormal results are displayed) Labs Reviewed  RESP PANEL BY  RT-PCR (RSV, FLU A&B, COVID)  [id number]     EKG     RADIOLOGY  I personally viewed, evaluated, and interpreted these images as part of my medical decision making, as well as reviewing the written report by the radiologist.  ED Provider Interpretation: No consolidation concerning for pneumonia  DG Chest Portable 1 View  Result Date: 09/05/2022 CLINICAL DATA:  Cough EXAM: PORTABLE CHEST 1 VIEW COMPARISON:  04/25/2013 FINDINGS: The heart size and mediastinal contours are within normal limits. Both lungs are clear. The visualized skeletal structures are unremarkable. IMPRESSION: No active disease. Electronically Signed   By: Jasmine Pang M.D.   On: 09/05/2022 22:18    PROCEDURES:  Critical Care performed: No  Procedures   MEDICATIONS ORDERED IN ED: Medications  predniSONE (DELTASONE) tablet 60 mg (has no administration in time range)  albuterol (PROVENTIL) (2.5 MG/3ML) 0.083% nebulizer solution 2.5 mg (has no administration in time range)  acetaminophen (TYLENOL) tablet 650 mg (650 mg Oral Given 09/05/22 2209)     IMPRESSION / MDM / ASSESSMENT AND PLAN / ED COURSE  I reviewed the  triage vital signs and the nursing notes.                                 Differential diagnosis includes, but is not limited to, viral illness, COVID, flu, bronchitis, pneumonia   Patient's presentation is most consistent with acute presentation with potential threat to life or bodily function.   Patient's diagnosis is consistent with COVID-19.  Patient presents emergency department complaining of cough, body aches, headache.  Known contact with COVID-positive individual.  Symptoms x 2 days.  Patient will be given steroid and breathing treatment here.  X-ray revealed no evidence of pneumonia.  Ongoing steroid, albuterol and cough medication as prescribed for the patient at home.  Return precautions discussed.  Follow-up with primary care as needed..  Patient is given ED precautions to return to the ED  for any worsening or new symptoms.     FINAL CLINICAL IMPRESSION(S) / ED DIAGNOSES   Final diagnoses:  Viral illness     Rx / DC Orders   ED Discharge Orders          Ordered    predniSONE (DELTASONE) 50 MG tablet  Daily with breakfast        09/05/22 2231    albuterol (VENTOLIN HFA) 108 (90 Base) MCG/ACT inhaler  Every 2 hours PRN        09/05/22 2231    brompheniramine-pseudoephedrine-DM 30-2-10 MG/5ML syrup  4 times daily PRN        09/05/22 2231    benzonatate (TESSALON PERLES) 100 MG capsule  3 times daily PRN        09/05/22 2231             Note:  This document was prepared using Dragon voice recognition software and may include unintentional dictation errors.   Lanette Hampshire 09/05/22 2233    Pilar Jarvis, MD 09/12/22 [phone]

## 2023-11-23 ENCOUNTER — Encounter: Payer: Self-pay | Admitting: Internal Medicine

## 8386-10-15 DEATH — deceased
